# Patient Record
Sex: Male | Born: 1950 | Race: White | Hispanic: Refuse to answer | Marital: Married | Smoking: Never smoker
Health system: Southern US, Community
[De-identification: ages and names within clinical notes are randomized; demographics above are authoritative.]

## PROBLEM LIST (undated history)

## (undated) DIAGNOSIS — I10 Essential (primary) hypertension: Secondary | ICD-10-CM

## (undated) DIAGNOSIS — G629 Polyneuropathy, unspecified: Secondary | ICD-10-CM

## (undated) DIAGNOSIS — I2510 Atherosclerotic heart disease of native coronary artery without angina pectoris: Secondary | ICD-10-CM

## (undated) DIAGNOSIS — M1990 Unspecified osteoarthritis, unspecified site: Secondary | ICD-10-CM

## (undated) DIAGNOSIS — E785 Hyperlipidemia, unspecified: Secondary | ICD-10-CM

## (undated) DIAGNOSIS — E119 Type 2 diabetes mellitus without complications: Secondary | ICD-10-CM

## (undated) HISTORY — DX: Type 2 diabetes mellitus without complications: E11.9

## (undated) HISTORY — DX: Essential (primary) hypertension: I10

## (undated) HISTORY — PX: HERNIA REPAIR: SHX51

## (undated) HISTORY — DX: Hyperlipidemia, unspecified: E78.5

---
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
## 1976-11-23 HISTORY — PX: LEG SURGERY: SHX1003

## 1976-11-23 HISTORY — PX: ANKLE SURGERY: SHX546

## 2003-08-31 DIAGNOSIS — E119 Type 2 diabetes mellitus without complications: Secondary | ICD-10-CM | POA: Insufficient documentation

## 2004-03-28 DIAGNOSIS — M19079 Primary osteoarthritis, unspecified ankle and foot: Secondary | ICD-10-CM | POA: Insufficient documentation

## 2011-02-13 DIAGNOSIS — E7800 Pure hypercholesterolemia, unspecified: Secondary | ICD-10-CM | POA: Insufficient documentation

## 2014-05-24 DIAGNOSIS — Z79899 Other long term (current) drug therapy: Secondary | ICD-10-CM | POA: Insufficient documentation

## 2015-11-24 HISTORY — PX: BACK SURGERY: SHX140

## 2021-02-06 DIAGNOSIS — E113293 Type 2 diabetes mellitus with mild nonproliferative diabetic retinopathy without macular edema, bilateral: Secondary | ICD-10-CM | POA: Insufficient documentation

## 2021-09-22 DIAGNOSIS — Z87891 Personal history of nicotine dependence: Secondary | ICD-10-CM | POA: Insufficient documentation

## 2022-03-26 ENCOUNTER — Encounter: Payer: Self-pay | Admitting: Cardiovascular Disease

## 2022-03-26 ENCOUNTER — Ambulatory Visit (INDEPENDENT_AMBULATORY_CARE_PROVIDER_SITE_OTHER): Payer: Medicare Other | Admitting: Cardiovascular Disease

## 2022-03-26 DIAGNOSIS — E785 Hyperlipidemia, unspecified: Secondary | ICD-10-CM

## 2022-03-26 DIAGNOSIS — R079 Chest pain, unspecified: Secondary | ICD-10-CM | POA: Diagnosis not present

## 2022-03-26 DIAGNOSIS — R072 Precordial pain: Secondary | ICD-10-CM

## 2022-03-26 DIAGNOSIS — R0609 Other forms of dyspnea: Secondary | ICD-10-CM | POA: Diagnosis not present

## 2022-03-26 DIAGNOSIS — I10 Essential (primary) hypertension: Secondary | ICD-10-CM

## 2022-03-26 DIAGNOSIS — E119 Type 2 diabetes mellitus without complications: Secondary | ICD-10-CM

## 2022-03-26 MED ORDER — AMLODIPINE BESYLATE 5 MG PO TABS: 5.0000 mg | ORAL_TABLET | Freq: Every day | ORAL | 1 refills | Status: DC

## 2022-03-26 NOTE — Progress Notes (Signed)
? ?Cardiology Office Note   ? ?Date:  04/04/2022  ? ?ID:  Dustin Gladdenobert Macon, DOB 12-17-1950, MRN [id number] ? ?PCP:  Christena FlakeZimmer, William, MD  ?Cardiologist:  Nicki Guadalajarahomas Kenedie Dirocco, MD  ? ?Chief Complaint  ?Patient presents with  ? New Patient (Initial Visit)  ?  CAD.  ? ? ?History of Present Illness:  ?Dustin Goodwin is a 71 y.o. male who is the husband of my patient [name]  [name](Nancy) [place].  He lives in [place], [place].  He has retired and works on his farm cutting wood and mowing grass.  He has a history of hypertension as well as a history of diabetes mellitus for greater than 10 years.  Approximately 1 year ago he had experienced some chest pain associated with shortness of breath and diaphoresis.  He has been followed at [facility] in [place].  He subsequently had another episode which was relieved with nitroglycerin.  On August 27, 2021 he underwent a [place] Myoview study which raised the possibility of a small fixed defect inferiorly.  EF was 57% and there were no ECG changes.  He had an echo Doppler study on September 25, 2021 which showed an EF of 55 to 60% and he had normal wall motion.  Recently, he has experienced some recurrent episodes of chest discomfort and typically there may be 1 to 2 months without experiencing symptomatology.  His last episode occurred 1 week ago which was associated with chest pressure.  In the interim, he remains active and denies any exertional precipitation of discomfort.  Presently, he is on metoprolol succinate 50 mg daily, aspirin 81 mg milligrams, and has been on simvastatin 10 mg daily for hyperlipidemia.  He is on glimepiride, metformin and Ozempic for diabetes.  He has a history of neuropathy on gabapentin.  With his recurrent chest pains symptomatology he wished to be seen by me and presents now for initial cardiology evaluation. ? ?Past medical history is notable for hypertension, type 2 diabetes mellitus, peripheral neuropathy and hyperlipidemia. ? ?Surgical  history is notable for leg and back surgery following a severe fall where he had fallen 50 feet resulting in significant injury.  He also status post hernia surgery. ? ?Current Medications: ?Outpatient Medications Prior to Visit  ?Medication Sig Dispense Refill  ? aspirin 81 MG chewable tablet Chew 81 mg by mouth daily.    ? diclofenac (VOLTAREN) 50 MG EC tablet Take 50 mg by mouth 2 (two) times daily.    ? gabapentin (NEURONTIN) 300 MG capsule Take 300 mg by mouth 3 (three) times daily.    ? glimepiride (AMARYL) 4 MG tablet Take 1 tablet by mouth daily.    ? HYDROcodone-acetaminophen (NORCO) 7.5-325 MG tablet Take 1 tablet by mouth every 6 (six) hours as needed.    ? metFORMIN (GLUCOPHAGE-XR) 750 MG 24 hr tablet Take 1 tablet by mouth 2 (two) times daily.    ? metoprolol succinate (TOPROL-XL) 50 MG 24 hr tablet Take 50 mg by mouth daily.    ? nitroGLYCERIN (NITROSTAT) 0.4 MG SL tablet Place 0.4 mg under the tongue as directed.    ? OZEMPIC, 1 MG/DOSE, 4 MG/3ML SOPN Inject 1 mg into the skin once a week.    ? simvastatin (ZOCOR) 10 MG tablet Take 10 mg by mouth at bedtime.    ? ?No facility-administered medications prior to visit.  ?  ? ?Allergies:   Patient has no known allergies.  ? ?Social History  ? ?Socioeconomic History  ? Marital status: Married  ?  Spouse name: Not on file  ? Number of children: Not on file  ? Years of education: Not on file  ? Highest education level: Not on file  ?Occupational History  ? Not on file  ?Tobacco Use  ? Smoking status: Not on file  ? Smokeless tobacco: Not on file  ?Substance and Sexual Activity  ? Alcohol use: Not on file  ? Drug use: Not on file  ? Sexual activity: Not on file  ?Other Topics Concern  ? Not on file  ?Social History Narrative  ? Not on file  ? ?Social Determinants of Health  ? ?Financial Resource Strain: Not on file  ?Food Insecurity: Not on file  ?Transportation Needs: Not on file  ?Physical Activity: Not on file  ?Stress: Not on file  ?Social Connections:  Not on file  ?  ?Social history is notable that he was born in [place].  He has been married for 46 years.  He retired at age 79 from [employer] which [employer].  There is no tobacco alcohol history.  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 ?Family History: Both parents are deceased, mother at age 65 with diabetes father at age 75 with Parkinson's disease.  He has 2 living brothers ages 58 and 28 both with diabetes.  He has a sister age 85. ? ?ROS ?General: Negative; No fevers, chills, or night sweats;  ?HEENT: Negative; No changes in vision or hearing, sinus congestion, difficulty swallowing ?Pulmonary: Negative; No cough, wheezing, shortness of breath, hemoptysis ?Cardiovascular: See HPI ?GI: Negative; No nausea, vomiting, diarrhea, or abdominal pain ?GU: Negative; No dysuria, hematuria, or difficulty voiding ?Musculoskeletal: Remote fall leading to back and leg issues ?Hematologic/Oncology: Negative; no easy bruising, bleeding ?Endocrine: Negative; no heat/cold intolerance; no diabetes ?Neuro: Negative; no changes in balance, headaches ?Skin: Negative; No rashes or skin lesions ?Psychiatric: Negative; No behavioral problems, depression ?Sleep: Negative; No snoring, daytime sleepiness, hypersomnolence, bruxism, restless legs, hypnogognic hallucinations, no cataplexy ?Other comprehensive 14 point system review is negative. ? ? ?PHYSICAL EXAM:   ?VS:  BP 114/76 (BP Location: Left Arm, Patient Position: Sitting, Cuff Size: Normal)   Pulse 68   Ht 5\' 8"  (1.727 m)   Wt 188 lb (85.3 kg)   BMI 28.59 kg/m?    ? ?Repeat blood pressure by me was elevated at 170/80 and repeat 164/80. ? ?Wt Readings from Last 3 Encounters:  ?03/26/22 188 lb (85.3 kg)  ?  ?General: Alert, oriented, no distress.  ?Skin: normal turgor, no rashes, warm and dry ?HEENT: Normocephalic, atraumatic. Pupils equal round and reactive to light; sclera anicteric; extraocular muscles intact; ?Nose without nasal  septal hypertrophy ?Mouth/Parynx benign; Mallinpatti scale 3 ?Neck: No JVD, no carotid bruits; normal carotid upstroke ?Lungs: clear to ausculatation and percussion; no wheezing or rales ?Chest wall: without tenderness to palpitation ?Heart: PMI not displaced, RRR, s1 s2 normal, 1/6 systolic murmur, no diastolic murmur, no rubs, gallops, thrills, or heaves ?Abdomen: soft, nontender; no hepatosplenomehaly, BS+; abdominal aorta nontender and not dilated by palpation. ?Back: no CVA tenderness ?Pulses 2+ ?Musculoskeletal: full range of motion, normal strength, no joint deformities ?Extremities: no clubbing cyanosis or edema, Homan's sign negative  ?Neurologic: grossly nonfocal; Cranial nerves grossly wnl ?Psychologic: Normal mood and affect ? ? ?Studies/Labs Reviewed:  ? ?Mar 26, 2022 ECG (independently read by me):  NSR at 68, no STT changes, no ectopy, normal intervals ? ?Recent Labs: ?He brought with him recent laboratory from Dr. Mar 28, 2022 from March 20, 2022. ?Hemoglobin 14.9/hematocrit 43.4/platelets 229,000 ?Potassium 4.1, creatinine 1.32.  Glucose 155, AST 26, ALT 24. ? ?   ? View : No data to display.  ?  ?  ?  ? ? ? ?   ? View : No data to display.  ?  ?  ?  ? ? ?   ? View : No data to display.  ?  ?  ?  ? ?No results found for: MCV ?Lab Results  ?Component Value Date  ? TSH 0.648 03/27/2022  ? ?No results found for: HGBA1C ? ? ?BNP ?No results found for: BNP ? ?ProBNP ?No results found for: PROBNP ? ? ?Lipid Panel  ?   ?Component Value Date/Time  ? CHOL 90 (L) 03/27/2022 1004  ? TRIG 136 03/27/2022 1004  ? HDL 33 (L) 03/27/2022 1004  ? CHOLHDL 2.7 03/27/2022 1004  ? LDLCALC 33 03/27/2022 1004  ? LABVLDL 24 03/27/2022 1004  ? ? ? ?RADIOLOGY: ?No results found. ? ? ?Additional studies/ records that were reviewed today include:  ?I reviewed the records of Dr. Christena Flake, as well as [facility] clinic as well as his nuclear medicine stress test and 2D echo Doppler evaluations.  Laboratory was  reviewed. ? ?ASSESSMENT:   ? ?1. Chest pain of uncertain etiology   ?2. Precordial pain   ?3. Exertional dyspnea   ?4. Primary hypertension   ?5. Type 2 diabetes mellitus without complication, without long-term current use o

## 2022-03-26 NOTE — Patient Instructions (Addendum)
Medication Instructions:  ?START Amlodipine 5 mg daily  ?Take your Metoprolol 50 mg (2 hours before CT)  ? ?*If you need a refill on your cardiac medications before your next appointment, please call your pharmacy* ? ? ?Lab Work: ?LIPID, LPa, TSH- come back fasting, nothing to eat or drink.  ? ?If you have labs (blood work) drawn today and your tests are completely normal, you will receive your results only by: ?MyChart Message (if you have MyChart) OR ?A paper copy in the mail ?If you have any lab test that is abnormal or we need to change your treatment, we will call you to review the results. ? ? ?Testing/Procedures: ?Your physician has requested that you have cardiac CT. Cardiac computed tomography (CT) is a painless test that uses an x-ray machine to take clear, detailed pictures of your heart. For further information please visit [email or web address] Please follow instruction sheet as given. ? ? ?Follow-Up: ?At [facility], you and your health needs are our priority.  As part of our continuing mission to provide you with exceptional heart care, we have created designated Provider Care Teams.  These Care Teams include your primary Cardiologist (physician) and Advanced Practice Providers (APPs -  Physician Assistants and Nurse Practitioners) who all work together to provide you with the care you need, when you need it. ? ?We recommend signing up for the patient portal called "MyChart".  Sign up information is provided on this After Visit Summary.  MyChart is used to connect with patients for Virtual Visits (Telemedicine).  Patients are able to view lab/test results, encounter notes, upcoming appointments, etc.  Non-urgent messages can be sent to your provider as well.   ?To learn more about what you can do with MyChart, go to [email or web address].   ? ?Your next appointment:   ?June 5th @ 11:40  ? ?The format for your next appointment:   ?In Person ? ?Provider:   ?[name], MD  ? ? ? ?Your cardiac CT  will be scheduled at one of the below locations:  ? ?[facility] ?[address] ?[place], [place] [address] ?[phone] ? ?If scheduled at [facility], please arrive at the [facility] Entrance (Entrance C2) of [facility] 30 minutes prior to test start time. ?You can use the FREE valet parking offered at entrance C (encouraged to control the heart rate for the test)  ?Proceed to the [facility] Radiology Department (first floor) to check-in and test prep. ? ?All radiology patients and guests should use entrance C2 at [facility], accessed from [facility], even though the hospital's physical address listed is [address] ? ? ? ? ?Please follow these instructions carefully (unless otherwise directed): ? ?Hold all erectile dysfunction medications at least 3 days (72 hrs) prior to test. ? ?On the Night Before the Test: ?Be sure to Drink plenty of water. ?Do not consume any caffeinated/decaffeinated beverages or chocolate 12 hours prior to your test. ?Do not take any antihistamines 12 hours prior to your test. ? ?On the Day of the Test: ?Drink plenty of water until 1 hour prior to the test. ?Do not eat any food 4 hours prior to the test. ?You may take your regular medications prior to the test.  ?Take metoprolol (Lopressor) two hours prior to test. ?HOLD Furosemide/Hydrochlorothiazide morning of the test. ?FEMALES- please wear underwire-free bra if available, avoid dresses & tight clothing ?     ?After the Test: ?Drink plenty  of water. ?After receiving IV contrast, you may experience a mild flushed feeling. This is normal. ?On occasion, you may experience a mild rash up to 24 hours after the test. This is not dangerous. If this occurs, you can take Benadryl 25 mg and increase your fluid intake. ?If you experience trouble breathing, this can be serious. If it is severe call 911 IMMEDIATELY. If it is mild, please call our office. ?If you take any  of these medications: Glipizide/Metformin, Avandament, Glucavance, please do not take 48 hours after completing test unless otherwise instructed. ? ?We will call to schedule your test 2-4 weeks out understanding that some insurance companies will need an authorization prior to the service being performed.  ? ?For non-scheduling related questions, please contact the cardiac imaging nurse navigator should you have any questions/concerns: ?Rockwell Alexandria, Cardiac Imaging Nurse Navigator ?Larey Brick, Cardiac Imaging Nurse Navigator ?[facility] ?Direct Office Dial: [phone]  ? ?For scheduling needs, including cancellations and rescheduling, please call [place], [phone]. ? ? ? ? ? ? ? ? ?

## 2022-03-29 LAB — LIPID PANEL
Chol/HDL Ratio: 2.7 ratio (ref 0.0–5.0)
Cholesterol, Total: 90 mg/dL — ABNORMAL LOW (ref 100–199)
HDL: 33 mg/dL — ABNORMAL LOW (ref >39)
LDL Chol Calc (NIH): 33 mg/dL (ref 0–99)
Triglycerides: 136 mg/dL (ref 0–149)
VLDL Cholesterol Cal: 24 mg/dL (ref 5–40)

## 2022-03-29 LAB — LIPOPROTEIN A (LPA): Lipoprotein (a): 147.8 nmol/L — ABNORMAL HIGH (ref <75.0)

## 2022-03-29 LAB — TSH: TSH: 0.648 u[IU]/mL (ref 0.450–4.500)

## 2022-04-04 ENCOUNTER — Encounter: Payer: Self-pay | Admitting: Cardiovascular Disease

## 2022-04-10 ENCOUNTER — Telehealth (HOSPITAL_COMMUNITY): Payer: Self-pay | Admitting: *Deleted

## 2022-04-10 NOTE — Telephone Encounter (Signed)
Reaching out to patient to offer assistance regarding upcoming cardiac imaging study; pt verbalizes understanding of appt date/time, parking situation and where to check in, pre-test NPO status and verified current allergies; name and call back number provided for further questions should they arise  Larey Brick RN Navigator Cardiac Imaging [facility] [phone] office [phone] cell  Patient to take his daily medications.  He aware to arrive at 1:30pm.

## 2022-04-13 ENCOUNTER — Ambulatory Visit (HOSPITAL_COMMUNITY)
Admission: RE | Admit: 2022-04-13 | Discharge: 2022-04-13 | Disposition: A | Discharge status: Home / Self Care | Payer: Medicare Other | Source: Ambulatory Visit | Attending: Cardiovascular Disease | Admitting: Cardiovascular Disease

## 2022-04-13 ENCOUNTER — Other Ambulatory Visit: Payer: Self-pay | Admitting: Cardiovascular Disease

## 2022-04-13 DIAGNOSIS — R079 Chest pain, unspecified: Secondary | ICD-10-CM | POA: Insufficient documentation

## 2022-04-13 DIAGNOSIS — R931 Abnormal findings on diagnostic imaging of heart and coronary circulation: Secondary | ICD-10-CM | POA: Insufficient documentation

## 2022-04-13 DIAGNOSIS — R072 Precordial pain: Secondary | ICD-10-CM | POA: Insufficient documentation

## 2022-04-13 DIAGNOSIS — I2510 Atherosclerotic heart disease of native coronary artery without angina pectoris: Secondary | ICD-10-CM | POA: Diagnosis not present

## 2022-04-13 IMAGING — CT CT HEART MORP W/ CTA COR W/ SCORE W/ CA W/CM &/OR W/O CM
4 of 8 series · 8 of 20 positions shown, 9 images · IV contrast (APPLIED)
Comparison: None Available.
COMPARISON: None Available.

Addendum:
EXAM:
OVER-READ INTERPRETATION  CT CHEST

The following report is a limited chest CT over-read performed by
04/13/2022. This over-read does not include interpretation of cardiac
or coronary anatomy or pathology. The coronary calcium
score/coronary CTA interpretation by the cardiologist is attached.
CLINICAL DATA: Chest pain
Cardiac/Coronary CTA
TECHNIQUE: A non-contrast, gated CT scan was obtained with axial slices of 3 mm
through the heart for calcium scoring. Calcium scoring was performed
using the Agatston method. A 120 kV prospective, gated, contrast
cardiac scan was obtained. Gantry rotation speed was 250 msecs and
collimation was 0.6 mm. Two sublingual nitroglycerin tablets (0.8
mg) were given. The 3D data set was reconstructed in 5% intervals of
the 35-75% of the R-R cycle. Diastolic phases were analyzed on a
dedicated workstation using MPR, MIP, and VRT modes. The patient
received 95 cc of contrast.

[Series 6: ts syst sharp · axial · 0.39mm/px · z∈[-218,-185]mm · 2 of 248 slices shown]
[im 83/248  lung]
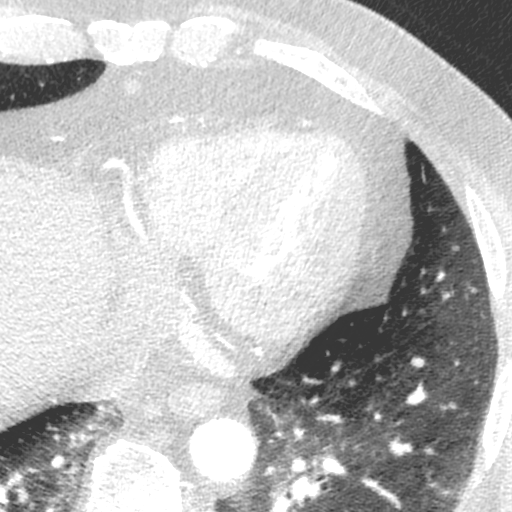
[im 165/248  lung]
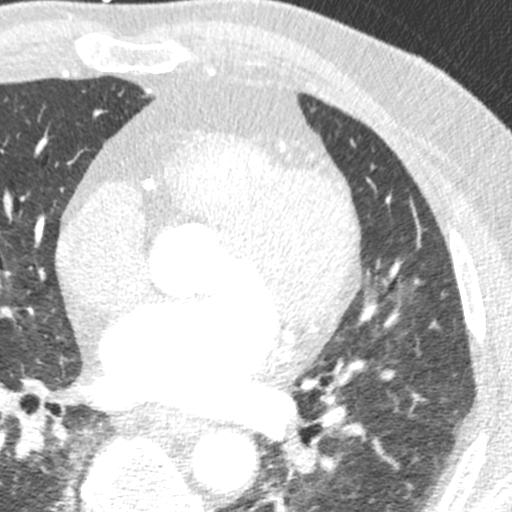

[Series 7: ts diast sharp · axial · 0.39mm/px · z∈[-218,-185]mm · 2 of 248 slices shown]
[im 83/248  lung]
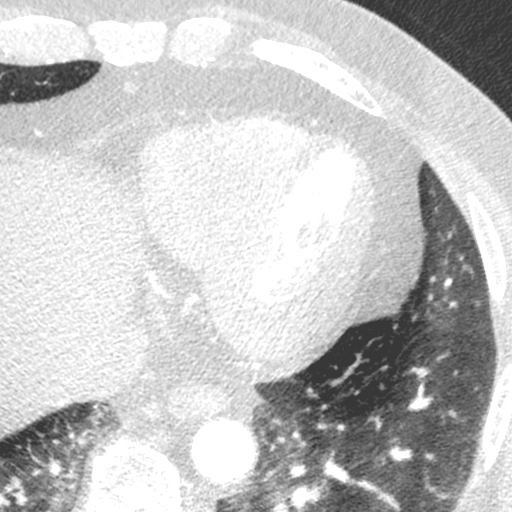
[im 165/248  lung]
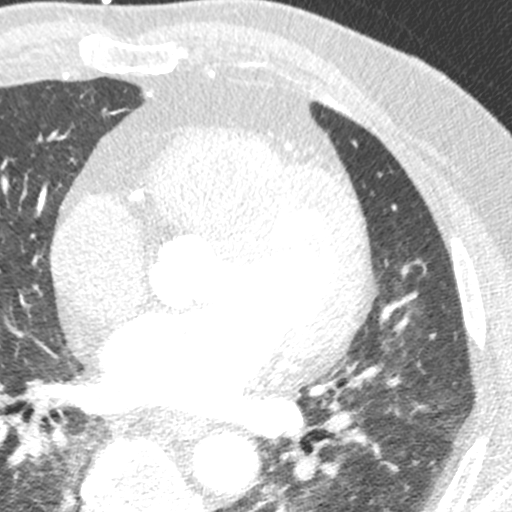

[Series 8: best syst · axial · 0.39mm/px · z∈[-218,-185]mm · 2 of 248 slices shown, 3 images]
[im 83/248  vessel]
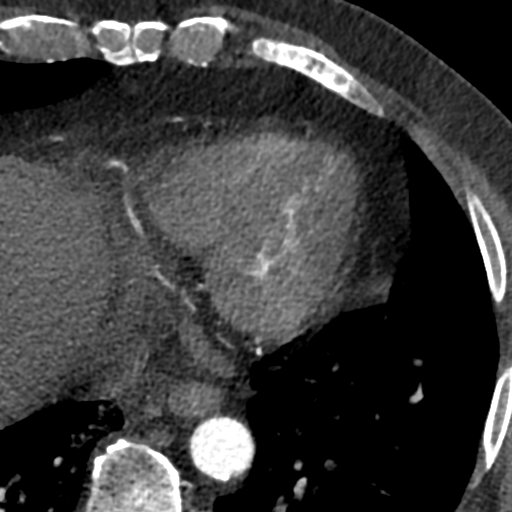
[im 83/248  lung]
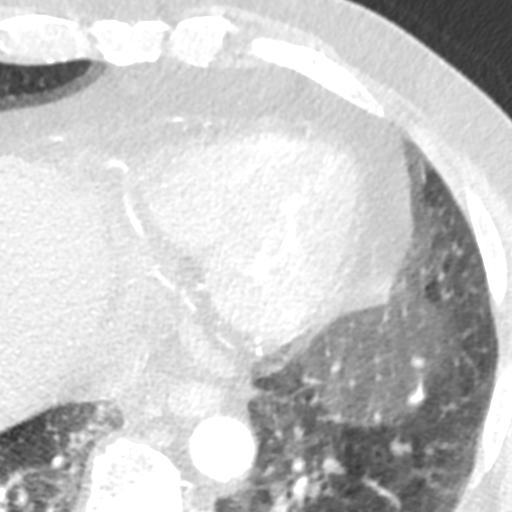
[im 165/248  vessel]
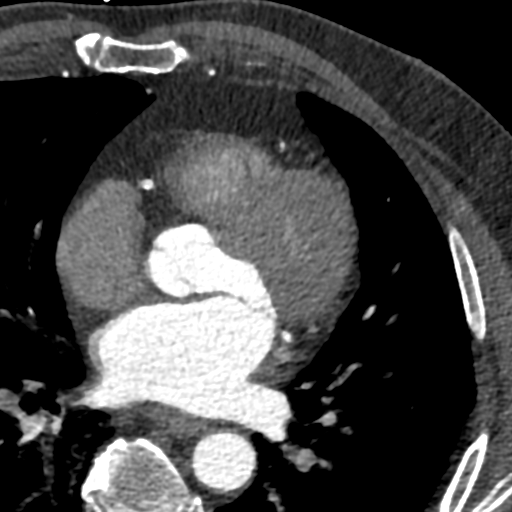

[Series 9: best diast · axial · 0.39mm/px · z∈[-218,-185]mm · 2 of 248 slices shown]
[im 83/248  vessel]
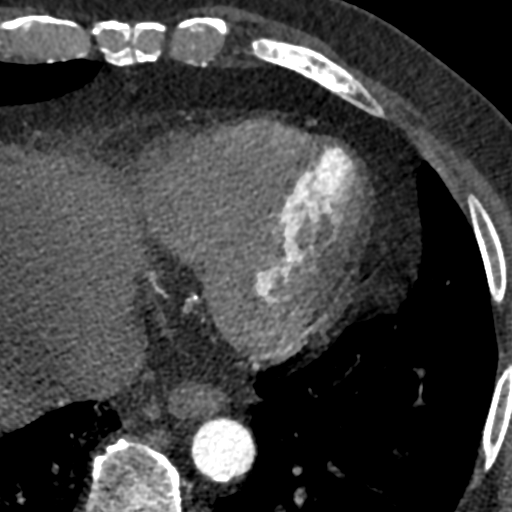
[im 165/248  vessel]
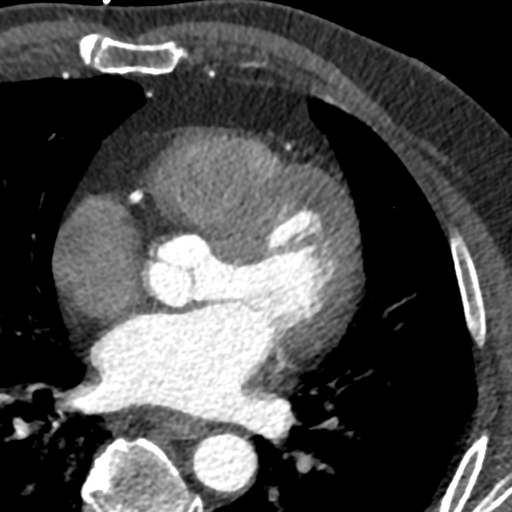

[8 of 20 positions shown; findings below may reference images not displayed]

FINDINGS: Vascular: No acute abnormality.

Mediastinum/Nodes: No mediastinal mass or adenopathy.

Lungs/Pleura: No pleural effusion. Scar versus subsegmental
atelectasis noted in the posterior right lung base, image 49/11. No
airspace consolidation. No suspicious lung nodules identified.

Upper Abdomen: No acute abnormality.

Musculoskeletal: No chest wall mass or suspicious bone lesions
identified.
IMPRESSION: 1. No significant, noncardiac supplemental findings identified.
FINDINGS: Image quality: Average.  Moderate cardiac motion artifact noted.

Noise artifact is: Limited.

Coronary Arteries:  Normal coronary origin.  Right dominance.

Left main: The left main is a large caliber vessel with a normal
take off from the left coronary cusp that bifurcates to form a left
anterior descending artery and a left circumflex artery. There is no
plaque or stenosis.

Left anterior descending artery: The proximal LAD contains a severe
calcified plaque that is circumferential (70-99%). The mid LAD is
diffusely disease with severe mixed density plaque (70-99%).
Diagonal branches are too small for analysis.

Left circumflex artery: The LCX is non-dominant. The proximal LCX
contains mild calcified plaque (25-49%). The distal LCX likely
contains severe mixed density plaque (70-99%). OM1 is too small for
analysis. OM2 contains severe mixed density plaque (70-99%). OM3 is
not evaluable due to significant artifact.

Right coronary artery: The RCA is dominant with normal take off from
the right coronary cusp. The proximal and mid segments contain mild
mixed density plaque (25-49%). The distal RCA contains mild
calcified plaque (25-49%). The PDA is not able to be evaluated due
to motion artifact.

Right Atrium: Right atrial size is within normal limits.

Right Ventricle: The right ventricular cavity is within normal
limits.

Left Atrium: Left atrial size is normal in size with no left atrial
appendage filling defect.

Left Ventricle: The ventricular cavity size is within normal limits.

Pulmonary arteries: Normal in size without proximal filling defect.

Pulmonary veins: Normal pulmonary venous drainage.

Pericardium: Normal thickness without significant effusion or
calcium present.

Cardiac valves: The aortic valve is trileaflet without significant
calcification. The mitral valve is normal without significant
calcification.

Aorta: Normal caliber without significant disease.

Extra-cardiac findings: See attached radiology report for
non-cardiac structures.
IMPRESSION: 1. Coronary calcium score of 5800. This was 88th percentile for
age-, sex, and race-matched controls.

2. Normal coronary origin with right dominance.

3. Severe stenosis in the proximal and mid LAD (70-99%).

4. Severe stenosis in the distal LCX (70-99%) and OM2.

5. Mild CAD (25-49%) in the RCA.

6. Limited evaluation of distal vessels due to poor heart rate
control/cardiac motion artifact.

RECOMMENDATIONS:
1. Severe stenosis in the LAD/LCX. CT FFR will be submitted.
Consider symptom-guided anti-ischemic pharmacotherapy as well as
risk factor modification per guideline directed care. Invasive
coronary angiography recommended with revascularization per
published guideline statements.

*** End of Addendum ***
EXAM:
OVER-READ INTERPRETATION  CT CHEST

The following report is a limited chest CT over-read performed by
04/13/2022. This over-read does not include interpretation of cardiac
or coronary anatomy or pathology. The coronary calcium
score/coronary CTA interpretation by the cardiologist is attached.
FINDINGS: Vascular: No acute abnormality.

Mediastinum/Nodes: No mediastinal mass or adenopathy.

Lungs/Pleura: No pleural effusion. Scar versus subsegmental
atelectasis noted in the posterior right lung base, image 49/11. No
airspace consolidation. No suspicious lung nodules identified.

Upper Abdomen: No acute abnormality.

Musculoskeletal: No chest wall mass or suspicious bone lesions
identified.
IMPRESSION: 1. No significant, noncardiac supplemental findings identified.

## 2022-04-13 MED ORDER — NITROGLYCERIN 0.4 MG SL SUBL
SUBLINGUAL_TABLET | SUBLINGUAL | Status: AC
  Filled 2022-04-13: qty 2

## 2022-04-13 MED ORDER — IOHEXOL 350 MG/ML SOLN
95.0000 mL | Freq: Once | INTRAVENOUS | Status: AC | PRN
  Administered 2022-04-13: 95 mL via INTRAVENOUS

## 2022-04-13 MED ORDER — METOPROLOL TARTRATE 5 MG/5ML IV SOLN
INTRAVENOUS | Status: AC
  Administered 2022-04-13: 5 mg via INTRAVENOUS
  Filled 2022-04-13: qty 10

## 2022-04-13 MED ORDER — NITROGLYCERIN 0.4 MG SL SUBL
0.8000 mg | SUBLINGUAL_TABLET | Freq: Once | SUBLINGUAL | Status: AC
  Administered 2022-04-13: 0.8 mg via SUBLINGUAL

## 2022-04-13 MED ORDER — METOPROLOL TARTRATE 5 MG/5ML IV SOLN: 5.0000 mg | INTRAVENOUS | Status: DC | PRN

## 2022-04-13 NOTE — Progress Notes (Signed)
CT FFR ordered.   Dustin Spore T. Flora Lipps, MD, [facility]  [facility]  [address], [address] [place], [place] [address] [phone]  3:59 PM

## 2022-04-14 ENCOUNTER — Ambulatory Visit (HOSPITAL_BASED_OUTPATIENT_CLINIC_OR_DEPARTMENT_OTHER)
Admission: RE | Admit: 2022-04-14 | Discharge: 2022-04-14 | Disposition: A | Discharge status: Home / Self Care | Payer: Medicare Other | Source: Ambulatory Visit | Attending: Cardiovascular Disease | Admitting: Cardiovascular Disease

## 2022-04-14 ENCOUNTER — Ambulatory Visit (HOSPITAL_COMMUNITY)
Admission: RE | Admit: 2022-04-14 | Discharge: 2022-04-14 | Disposition: A | Discharge status: Home / Self Care | Payer: Medicare Other | Source: Ambulatory Visit | Attending: Cardiovascular Disease | Admitting: Cardiovascular Disease

## 2022-04-14 DIAGNOSIS — R931 Abnormal findings on diagnostic imaging of heart and coronary circulation: Secondary | ICD-10-CM

## 2022-04-27 ENCOUNTER — Encounter: Payer: Self-pay | Admitting: Cardiovascular Disease

## 2022-04-27 ENCOUNTER — Ambulatory Visit (INDEPENDENT_AMBULATORY_CARE_PROVIDER_SITE_OTHER): Payer: Medicare Other | Admitting: Cardiovascular Disease

## 2022-04-27 VITALS — BP 120/70 | HR 59 | Ht 68.0 in | Wt 183.8 lb

## 2022-04-27 DIAGNOSIS — I209 Angina pectoris, unspecified: Secondary | ICD-10-CM | POA: Diagnosis not present

## 2022-04-27 DIAGNOSIS — R0609 Other forms of dyspnea: Secondary | ICD-10-CM | POA: Diagnosis not present

## 2022-04-27 DIAGNOSIS — R931 Abnormal findings on diagnostic imaging of heart and coronary circulation: Secondary | ICD-10-CM

## 2022-04-27 DIAGNOSIS — E785 Hyperlipidemia, unspecified: Secondary | ICD-10-CM

## 2022-04-27 DIAGNOSIS — Z01812 Encounter for preprocedural laboratory examination: Secondary | ICD-10-CM

## 2022-04-27 DIAGNOSIS — E119 Type 2 diabetes mellitus without complications: Secondary | ICD-10-CM | POA: Diagnosis not present

## 2022-04-27 DIAGNOSIS — E7841 Elevated Lipoprotein(a): Secondary | ICD-10-CM

## 2022-04-27 MED ORDER — ISOSORBIDE MONONITRATE ER 30 MG PO TB24: 30.0000 mg | ORAL_TABLET | Freq: Every day | ORAL | 3 refills | Status: DC

## 2022-04-27 NOTE — H&P (View-Only) (Signed)
Cardiology Office Note    Date:  04/27/2022   ID:  Dustin GladdenRobert Waldorf, DOB 07-06-1951, MRN [id number]  PCP:  Christena FlakeZimmer, William, MD  Cardiologist:  Nicki Guadalajarahomas Syriana Croslin, MD   1 month F/U evaluation   History of Present Illness:  Dustin Goodwin is a 71 y.o. male who is the husband of my patient Dustin AspCindy  [name](Nancy) Goodwin.  He lives in [place], [place].  He has retired and works on his farm cutting wood and mowing grass.  He has a history of hypertension as well as a history of diabetes mellitus for greater than 10 years.  Approximately 1 year ago he had experienced some chest pain associated with shortness of breath and diaphoresis.  He has been followed at [facility] in [place].  He subsequently had another episode which was relieved with nitroglycerin.  On August 27, 2021 he underwent a [place] Myoview study which raised the possibility of a small fixed defect inferiorly.  EF was 57% and there were no ECG changes.  He had an echo Doppler study on September 25, 2021 which showed an EF of 55 to 60% and he had normal wall motion.  Recently, he has experienced some recurrent episodes of chest discomfort and typically there may be 1 to 2 months without experiencing symptomatology.  His last episode occurred 1 week ago which was associated with chest pressure.  In the interim, he remains active and denies any exertional precipitation of discomfort.  Presently, he is on metoprolol succinate 50 mg daily, aspirin 81 mg milligrams, and has been on simvastatin 10 mg daily for hyperlipidemia.  He is on glimepiride, metformin and Ozempic for diabetes.  He has a history of neuropathy on gabapentin.  With his recurrent chest pains symptomatology he wished to be seen by me and I saw him for my initial evaluation on Mar 26, 2022.  At his initial evaluation, his blood pressure was elevated.  He had been on metoprolol succinate 50 mg daily and his ventricular rate was in the 60s.  I recommended follow-up laboratory  with a fasting lipid panel, TSH and LP(a).  I started him on amlodipine 5 mg both for blood pressure control and potential anti-ischemic benefit.  I recommended he undergo coronary CTA for further evaluation of his chest pain, and evaluate for coronary calcification and potential luminal stenosis.  Since I last saw him, he denies any recurrent chest pain since instituting amlodipine and his blood pressure has improved.  He underwent coronary CTA on Apr 13, 2022 which revealed an elevated calcium score at 1283, representing 88 percentile for age, sex and race matched controls.  He was felt to have severe stenosis in the proximal and mid LAD in the range of 70 to 99% and severe stenosis in the distal circumflex and a range of 70 and 99% and OM 2 vessel.  There was mild 25 to 49% stenosis in the mid RCA.  Subsequent FFR analysis was performed which is positive for hemodynamic significance in the LAD at 0.56, distal LAD less than 0.50, the distal circumflex was felt possibly to be occluded.  OM 2 vessel was 0.71.  He was notified of these results and presents now for follow-up evaluation.  Past medical history is notable for hypertension, type 2 diabetes mellitus, peripheral neuropathy and hyperlipidemia.  Surgical history is notable for leg and back surgery following a severe fall where he had fallen 50 feet resulting in significant injury.  He also status post hernia surgery.  Current Medications: Outpatient  Medications Prior to Visit  Medication Sig Dispense Refill   amLODipine (NORVASC) 5 MG tablet Take 1 tablet (5 mg total) by mouth daily. 90 tablet 1   aspirin 81 MG chewable tablet Chew 81 mg by mouth daily.     diclofenac (VOLTAREN) 50 MG EC tablet Take 50 mg by mouth 2 (two) times daily.     gabapentin (NEURONTIN) 300 MG capsule Take 300 mg by mouth 3 (three) times daily.     glimepiride (AMARYL) 4 MG tablet Take 1 tablet by mouth daily.     HYDROcodone-acetaminophen (NORCO) 7.5-325 MG tablet  Take 1 tablet by mouth every 6 (six) hours as needed.     metFORMIN (GLUCOPHAGE-XR) 750 MG 24 hr tablet Take 1 tablet by mouth 2 (two) times daily.     metoprolol succinate (TOPROL-XL) 50 MG 24 hr tablet Take 50 mg by mouth daily.     nitroGLYCERIN (NITROSTAT) 0.4 MG SL tablet Place 0.4 mg under the tongue as directed.     OZEMPIC, 1 MG/DOSE, 4 MG/3ML SOPN Inject 1 mg into the skin once a week.     simvastatin (ZOCOR) 10 MG tablet Take 10 mg by mouth at bedtime.     traMADol (ULTRAM) 50 MG tablet Take 50 mg by mouth every 6 (six) hours as needed.     No facility-administered medications prior to visit.     Allergies:   Patient has no known allergies.   Social History   Socioeconomic History   Marital status: Married    Spouse name: Not on file   Number of children: Not on file   Years of education: Not on file   Highest education level: Not on file  Occupational History   Not on file  Tobacco Use   Smoking status: Never   Smokeless tobacco: Never  Substance and Sexual Activity   Alcohol use: Not on file   Drug use: Not on file   Sexual activity: Not on file  Other Topics Concern   Not on file  Social History Narrative   Not on file   Social Determinants of Health   Financial Resource Strain: Not on file  Food Insecurity: Not on file  Transportation Needs: Not on file  Physical Activity: Not on file  Stress: Not on file  Social Connections: Not on file    Social history is notable that he was born in [place].  He has been married for 46 years.  He retired at age 52 from [employer] which [employer].  There is no tobacco alcohol history.  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  He has 2 living brothers ages 52 and 1 both with diabetes.  He has a sister age 67.  ROS General: Negative; No fevers, chills, or night  sweats;  HEENT: Negative; No changes in vision or hearing, sinus congestion, difficulty swallowing Pulmonary: Negative; No cough, wheezing, shortness of breath, hemoptysis Cardiovascular: See HPI GI: Negative; No nausea, vomiting, diarrhea, or abdominal pain GU: Negative; No dysuria, hematuria, or difficulty voiding Musculoskeletal: Remote fall leading to back and leg issues Hematologic/Oncology: Negative; no easy bruising, bleeding Endocrine: Negative; no heat/cold intolerance; no diabetes Neuro: Negative; no changes in balance, headaches Skin: Negative; No rashes or skin lesions Psychiatric: Negative; No behavioral problems, depression Sleep: Negative; No snoring, daytime sleepiness, hypersomnolence, bruxism, restless legs, hypnogognic hallucinations, no cataplexy Other  comprehensive 14 point system review is negative.   PHYSICAL EXAM:   VS:  BP 120/70 (BP Location: Left Arm)   Pulse (!) 59   Ht  (1.727 m)   Wt 183 lb 12.8 oz (83.4 kg)   SpO2 97%   BMI 27.95 kg/m     Repeat blood pressure by me was 140/70  Wt Readings from Last 3 Encounters:  04/27/22 183 lb 12.8 oz (83.4 kg)  03/26/22 188 lb (85.3 kg)   General: Alert, oriented, no distress.  Skin: normal turgor, no rashes, warm and dry HEENT: Normocephalic, atraumatic. Pupils equal round and reactive to light; sclera anicteric; extraocular muscles intact;  Nose without nasal septal hypertrophy Mouth/Parynx benign; Mallinpatti scale 3 Neck: No JVD, no carotid bruits; normal carotid upstroke Lungs: clear to ausculatation and percussion; no wheezing or rales Chest wall: without tenderness to palpitation Heart: PMI not displaced, RRR, s1 s2 normal, 1/6 systolic murmur, no diastolic murmur, no rubs, gallops, thrills, or heaves Abdomen: soft, nontender; no hepatosplenomehaly, BS+; abdominal aorta nontender and not dilated by palpation. Back: no CVA tenderness Pulses 2+ Musculoskeletal: full range of motion, normal  strength, no joint deformities Extremities: no clubbing cyanosis or edema, Homan's sign negative  Neurologic: grossly nonfocal; Cranial nerves grossly wnl Psychologic: Normal mood and affect    Studies/Labs Reviewed:   April 27, 2022 ECG (independently read by me): Sinus bradycardia at 59, no ST changes  Mar 26, 2022 ECG (independently read by me):  NSR at 68, no STT changes, no ectopy, normal intervals  Recent Labs: He brought with him recent laboratory from Dr. Christena Flake from March 20, 2022. Hemoglobin 14.9/hematocrit 43.4/platelets 229,000 Potassium 4.1, creatinine 1.32.  Glucose 155, AST 26, ALT 24.      View : No data to display.               View : No data to display.              View : No data to display.         No results found for: MCV Lab Results  Component Value Date   TSH 0.648 03/27/2022   No results found for: HGBA1C   BNP No results found for: BNP  ProBNP No results found for: PROBNP   Lipid Panel     Component Value Date/Time   CHOL 90 (L) 03/27/2022 1004   TRIG 136 03/27/2022 1004   HDL 33 (L) 03/27/2022 1004   CHOLHDL 2.7 03/27/2022 1004   LDLCALC 33 03/27/2022 1004   LABVLDL 24 03/27/2022 1004     RADIOLOGY: CT CORONARY MORPH W/CTA COR W/SCORE W/CA W/CM &/OR WO/CM  Addendum Date: 04/13/2022   ADDENDUM REPORT: 04/13/2022 15:59 CLINICAL DATA:  Chest pain EXAM: Cardiac/Coronary CTA TECHNIQUE: A non-contrast, gated CT scan was obtained with axial slices of 3 mm through the heart for calcium scoring. Calcium scoring was performed using the Agatston method. A 120 kV prospective, gated, contrast cardiac scan was obtained. Gantry rotation speed was 250 msecs and collimation was 0.6 mm. Two sublingual nitroglycerin tablets (0.8 mg) were given. The 3D data set was reconstructed in 5% intervals of the 35-75% of the R-R cycle. Diastolic phases were analyzed on a dedicated workstation using MPR, MIP, and VRT modes. The patient received  95 cc of contrast. FINDINGS: Image quality: Average.  Moderate cardiac motion artifact noted. Noise artifact is: Limited. Coronary Arteries:  Normal coronary origin.  Right dominance. Left main: The left main is a  large caliber vessel with a normal take off from the left coronary cusp that bifurcates to form a left anterior descending artery and a left circumflex artery. There is no plaque or stenosis. Left anterior descending artery: The proximal LAD contains a severe calcified plaque that is circumferential (70-99%). The mid LAD is diffusely disease with severe mixed density plaque (70-99%). Diagonal branches are too small for analysis. Left circumflex artery: The LCX is non-dominant. The proximal LCX contains mild calcified plaque (25-49%). The distal LCX likely contains severe mixed density plaque (70-99%). OM1 is too small for analysis. OM2 contains severe mixed density plaque (70-99%). OM3 is not evaluable due to significant artifact. Right coronary artery: The RCA is dominant with normal take off from the right coronary cusp. The proximal and mid segments contain mild mixed density plaque (25-49%). The distal RCA contains mild calcified plaque (25-49%). The PDA is not able to be evaluated due to motion artifact. Right Atrium: Right atrial size is within normal limits. Right Ventricle: The right ventricular cavity is within normal limits. Left Atrium: Left atrial size is normal in size with no left atrial appendage filling defect. Left Ventricle: The ventricular cavity size is within normal limits. Pulmonary arteries: Normal in size without proximal filling defect. Pulmonary veins: Normal pulmonary venous drainage. Pericardium: Normal thickness without significant effusion or calcium present. Cardiac valves: The aortic valve is trileaflet without significant calcification. The mitral valve is normal without significant calcification. Aorta: Normal caliber without significant disease. Extra-cardiac findings:  See attached radiology report for non-cardiac structures. IMPRESSION: 1. Coronary calcium score of 1283. This was 88th percentile for age-, sex, and race-matched controls. 2. Normal coronary origin with right dominance. 3. 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 4. Severe stenosis in the distal LCX (70-99%) and OM2. 5. 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 6. Limited evaluation of distal vessels due to poor heart rate control/cardiac motion artifact. RECOMMENDATIONS: 1. Severe stenosis in the LAD/LCX. CT FFR will be submitted. Consider symptom-guided anti-ischemic pharmacotherapy as well as risk factor modification per guideline directed care. Invasive coronary angiography recommended with revascularization per published guideline statements. [place] O'Neal, MD Electronically Signed   By: [place]  O'Neal M.D.   On: 04/13/2022 15:59   Result Date: 04/13/2022 EXAM: OVER-READ INTERPRETATION  CT CHEST The following report is a limited chest CT over-read performed by radiologist Dr. Taylor Stroud of [facility] on 04/13/2022. This over-read does not include interpretation of cardiac or coronary anatomy or pathology. The coronary calcium score/coronary CTA interpretation by the cardiologist is attached. COMPARISON:  None Available. FINDINGS: Vascular: No acute abnormality. Mediastinum/Nodes: No mediastinal mass or adenopathy. Lungs/Pleura: No pleural effusion. Scar versus subsegmental atelectasis noted in the posterior right lung base, image 49/11. No airspace consolidation. No suspicious lung nodules identified. Upper Abdomen: No acute abnormality. Musculoskeletal: No chest wall mass or suspicious bone lesions identified. IMPRESSION: 1. No significant, noncardiac supplemental findings identified. Electronically Signed: By: Taylor  Stroud M.D. On: 04/13/2022 15:24   CT CORONARY FRACTIONAL FLOW RESERVE DATA PREP  Result Date: 04/13/2022 EXAM: CT FFR analysis was performed on the original cardiac CTA  dataset. Diagrammatic representation of the CT FFR analysis is provided in a separate PDF document in PACS. This dictation was created using the PDF document and an interactive 3D model of the results. The 3D model is not available in the EMR/PACS. INTERPRETATION: CT FFR provides simultaneous calculation of pressure and flow across the entire coronary tree. For clinical decision making, CT FFR values   should be obtained 1-2 cm distal to the lower border of each stenosis measured. Coronary CTA-related artifacts may impair the diagnostic accuracy of the original cardiac CTA and FFR CT results. *Due to the fact that CT FFR represents a mathematically-derived analysis, it is recommended that the results be interpreted as follows: 1. CT FFR >0.80: Low likelihood of hemodynamic significance. 2. CT FFR 0.76-0.80: Borderline likelihood of hemodynamic significance. 3. CT FFR =< 0.75: High likelihood of hemodynamic significance. *Coronary CT Angiography-derived Fractional Flow Reserve Testing in Patients with Stable Coronary Artery Disease: Recommendations on Interpretation and Reporting. Radiology: Cardiothoracic Imaging. 2019;1(5):e190050 FINDINGS: 1. Left Main: 0.99; low likelihood of hemodynamic significance. 2. Proximal LAD: 0.95; low likelihood of hemodynamic significance. 3. Mid LAD: 0.56; high likelihood of hemodynamic significance. 4. Distal LAD: <0.50; high likelihood of hemodynamic significance. 5. Proximal LCX: 0.97; low likelihood of hemodynamic significance. 6. Distal LCX: Not modeled, concern for occlusion. 7. OM2: 0.71; high likelihood of hemodynamic significance. 8. RCA: 0.94; low likelihood of hemodynamic significance. IMPRESSION: 1.  CT FFR positive for mid LAD (0.56) and OM2 (0.71). 2. Distal LCX not modeled as concerning for occlusion of CT FFR analysis. 3.  Cardiac catheterization recommended. Lennie Odor, MD Electronically Signed   By: Lennie Odor M.D.   On: 04/13/2022 17:38     Additional  studies/ records that were reviewed today include:  I reviewed the records of Dr. Christena Flake, as well as [facility] as well as his nuclear medicine stress test and 2D echo Doppler evaluations.  Laboratory was reviewed.  ASSESSMENT:    1. Abnormal cardiac CT angiography   2. Angina pectoris (HCC)   3. Exertional dyspnea   4. Type 2 diabetes mellitus without complication, without long-term current use of insulin (HCC)   5. Elevated Lp(a)   6. Hyperlipidemia with target LDL less than 70   7. Pre-procedure lab exam     PLAN:  Mr. Lum Stillinger is a very pleasant 71 year old gentleman who is the wife of one of my longstanding patients.  When I saw him for my initial evaluation on on Mar 26, 2022 he admitted to at least a 10-year history of diabetes mellitus as well as hypertension.   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  He has experienced several episodes of chest discomfort associated with shortness of breath and diaphoresis.  Initially this had occurred while walking up a steep hill.  He has undergone prior evaluation with a Lexiscan Myoview study which was low risk but suggested possible small fixed defect inferiorly with EF 57%.  A  2D echo Doppler study has shown normal LV function with normal wall motion.  Only, he had begun to experience some recurrent chest pain symptomatology and had episodes which would occur at 1 to 3-month intervals concerning for angina.  He was on metoprolol succinate 50 mg and with mild blood pressure elevation and potential underlying ischemia I added amlodipine 5 mg.  He has not had any recurrent chest pain since initiation of amlodipine.  I obtained follow-up laboratory which revealed an excellent LDL cholesterol at 33 however LP(a) was significantly increased at 147.8.  Recommended that the patient initiate aspirin therapy.  He had normal TSH level.  His coronary CTA reveals an elevated calcium score at 1283  and raises concern for hemodynamic significance of LAD circumflex and OM 2 lesions.  He is here with his wife today.  I had a long discussion with him  and reviewing the CTA as well as FFR analysis.  I also reviewed data with LP(a) being significantly elevated and potential risk for vulnerable plaque.  I have recommended definitive cardiac catheterization be performed to delineate his coronary anatomy with potential need for coronary intervention.I have reviewed the risks, indications, and alternatives to cardiac catheterization, possible angioplasty, and stenting with the patient. Risks include but are not limited to bleeding, infection, vascular injury, stroke, myocardial infection, arrhythmia, kidney injury, radiation-related injury in the case of prolonged fluoroscopy use, emergency cardiac surgery, and death. The patient understands the risks of serious complication is 1-2 in 1000 with diagnostic cardiac cath and 1-2% or less with angioplasty/stenting.  With his multivessel CAD, I am electing to add low-dose isosorbide 30 mg to his medical regimen.  He understands the risk benefits of the procedure and has consented to undergo cardiac catheterization which will be tentatively scheduled for next week, May 07, 2022.  I discussed with him potential need for coronary intervention, for potential surgical revascularization and discussed with him potential need to stay overnight.  I answered all his questions.  Laboratory will be obtained prior to his procedure   Medication Adjustments/Labs and Tests Ordered: Current medicines are reviewed at length with the patient today.  Concerns regarding medicines are outlined above.  Medication changes, Labs and Tests ordered today are listed in the Patient Instructions below. Patient Instructions   [facility] [address] [place] [address] Dept: [phone] Loc:  [phone]  Mahari Fleisher  04/27/2022  You are scheduled for a Cardiac Catheterization on Wednesday, June 14 with Dr. Sherea Liptak.  1. Please arrive at the Main Entrance A at [facility]: [address] [place], [place] [address] at 6:30 AM (This time is two hours before your procedure to ensure your preparation). Free valet parking service is available.   Special note: Every effort is made to have your procedure done on time. Please understand that emergencies sometimes delay scheduled procedures.  2. Diet: Do not eat solid foods after midnight.  You may have clear liquids until 5 AM upon the day of the procedure.  3. Labs: today (BMET, CBC)  4. Medication instructions in preparation for your procedure:   Contrast Allergy: No  Hold glimepiride AM of procedure  Do not take Diabetes Med Glucophage (Metformin) on the day of the procedure and HOLD 48 HOURS AFTER THE PROCEDURE.  On the morning of your procedure, take Aspirin and any morning medicines NOT listed above.  You may use sips of water.  5. Plan to go home the same day, you will only stay overnight if medically necessary. 6. You MUST have a responsible adult to drive you home. 7. An adult MUST be with you the first 24 hours after you arrive home. 8. Bring a current list of your medications, and the last time and date medication taken. 9. Bring ID and current insurance cards. 10.Please wear clothes that are easy to get on and off and wear slip-on shoes.  Thank you for allowing us to care for you!   -- [facility]   START isosorbide (Imdur) 30 mg daily  Follow up: 4-6 weeks with Dr. Chia Rock       Signed, Elbert Spickler, MD  04/27/2022 7:04 PM    [facility] [address], [address], [place], [place]  [address] Phone: [phone]    

## 2022-04-27 NOTE — Patient Instructions (Addendum)
  [place] [address] Dept: [phone] Loc: [phone]  Dustin Goodwin  04/27/2022  You are scheduled for a Cardiac Catheterization on Wednesday, June 14 with Dr. Shelva Majestic.  1. Please arrive at the Main Entrance A at [facility]: [place], [place] [address] at 6:30 AM (This time is two hours before your procedure to ensure your preparation). Free valet parking service is available.   Special note: Every effort is made to have your procedure done on time. Please understand that emergencies sometimes delay scheduled procedures.  2. Diet: Do not eat solid foods after midnight.  You may have clear liquids until 5 AM upon the day of the procedure.  3. Labs: today (BMET, CBC)  4. Medication instructions in preparation for your procedure:   Contrast Allergy: No  Hold glimepiride AM of procedure  Do not take Diabetes Med Glucophage (Metformin) on the day of the procedure and HOLD 48 HOURS AFTER THE PROCEDURE.  On the morning of your procedure, take Aspirin and any morning medicines NOT listed above.  You may use sips of water.  5. Plan to go home the same day, you will only stay overnight if medically necessary. 6. You MUST have a responsible adult to drive you home. 7. An adult MUST be with you the first 24 hours after you arrive home. 8. Bring a current list of your medications, and the last time and date medication taken. 9. Bring ID and current insurance cards. 10.Please wear clothes that are easy to get on and off and wear slip-on shoes.  Thank you for allowing [place] to care for you!   -- [facility]   START isosorbide (Imdur) 30 mg daily  Follow up: 4-6 weeks with Dr. Claiborne Billings

## 2022-04-27 NOTE — Progress Notes (Addendum)
Cardiology Office Note    Date:  04/27/2022   ID:  Dustin Goodwin, DOB 07-06-1951, MRN [id number]  PCP:  Christena FlakeZimmer, William, MD  Cardiologist:  Nicki Guadalajarahomas Davier Tramell, MD   1 month F/U evaluation   History of Present Illness:  Dustin Goodwin is a 71 y.o. male who is the husband of my patient [name]  [name](Nancy) [place].  He lives in [place], [place].  He has retired and works on his farm cutting wood and mowing grass.  He has a history of hypertension as well as a history of diabetes mellitus for greater than 10 years.  Approximately 1 year ago he had experienced some chest pain associated with shortness of breath and diaphoresis.  He has been followed at [facility] in [place].  He subsequently had another episode which was relieved with nitroglycerin.  On August 27, 2021 he underwent a [place] Myoview study which raised the possibility of a small fixed defect inferiorly.  EF was 57% and there were no ECG changes.  He had an echo Doppler study on September 25, 2021 which showed an EF of 55 to 60% and he had normal wall motion.  Recently, he has experienced some recurrent episodes of chest discomfort and typically there may be 1 to 2 months without experiencing symptomatology.  His last episode occurred 1 week ago which was associated with chest pressure.  In the interim, he remains active and denies any exertional precipitation of discomfort.  Presently, he is on metoprolol succinate 50 mg daily, aspirin 81 mg milligrams, and has been on simvastatin 10 mg daily for hyperlipidemia.  He is on glimepiride, metformin and Ozempic for diabetes.  He has a history of neuropathy on gabapentin.  With his recurrent chest pains symptomatology he wished to be seen by me and I saw him for my initial evaluation on Mar 26, 2022.  At his initial evaluation, his blood pressure was elevated.  He had been on metoprolol succinate 50 mg daily and his ventricular rate was in the 60s.  I recommended follow-up laboratory  with a fasting lipid panel, TSH and LP(a).  I started him on amlodipine 5 mg both for blood pressure control and potential anti-ischemic benefit.  I recommended he undergo coronary CTA for further evaluation of his chest pain, and evaluate for coronary calcification and potential luminal stenosis.  Since I last saw him, he denies any recurrent chest pain since instituting amlodipine and his blood pressure has improved.  He underwent coronary CTA on Apr 13, 2022 which revealed an elevated calcium score at 1283, representing 88 percentile for age, sex and race matched controls.  He was felt to have severe stenosis in the proximal and mid LAD in the range of 70 to 99% and severe stenosis in the distal circumflex and a range of 70 and 99% and OM 2 vessel.  There was mild 25 to 49% stenosis in the mid RCA.  Subsequent FFR analysis was performed which is positive for hemodynamic significance in the LAD at 0.56, distal LAD less than 0.50, the distal circumflex was felt possibly to be occluded.  OM 2 vessel was 0.71.  He was notified of these results and presents now for follow-up evaluation.  Past medical history is notable for hypertension, type 2 diabetes mellitus, peripheral neuropathy and hyperlipidemia.  Surgical history is notable for leg and back surgery following a severe fall where he had fallen 50 feet resulting in significant injury.  He also status post hernia surgery.  Current Medications: Outpatient  Medications Prior to Visit  Medication Sig Dispense Refill   amLODipine (NORVASC) 5 MG tablet Take 1 tablet (5 mg total) by mouth daily. 90 tablet 1   aspirin 81 MG chewable tablet Chew 81 mg by mouth daily.     diclofenac (VOLTAREN) 50 MG EC tablet Take 50 mg by mouth 2 (two) times daily.     gabapentin (NEURONTIN) 300 MG capsule Take 300 mg by mouth 3 (three) times daily.     glimepiride (AMARYL) 4 MG tablet Take 1 tablet by mouth daily.     HYDROcodone-acetaminophen (NORCO) 7.5-325 MG tablet  Take 1 tablet by mouth every 6 (six) hours as needed.     metFORMIN (GLUCOPHAGE-XR) 750 MG 24 hr tablet Take 1 tablet by mouth 2 (two) times daily.     metoprolol succinate (TOPROL-XL) 50 MG 24 hr tablet Take 50 mg by mouth daily.     nitroGLYCERIN (NITROSTAT) 0.4 MG SL tablet Place 0.4 mg under the tongue as directed.     OZEMPIC, 1 MG/DOSE, 4 MG/3ML SOPN Inject 1 mg into the skin once a week.     simvastatin (ZOCOR) 10 MG tablet Take 10 mg by mouth at bedtime.     traMADol (ULTRAM) 50 MG tablet Take 50 mg by mouth every 6 (six) hours as needed.     No facility-administered medications prior to visit.     Allergies:   Patient has no known allergies.   Social History   Socioeconomic History   Marital status: Married    Spouse name: Not on file   Number of children: Not on file   Years of education: Not on file   Highest education level: Not on file  Occupational History   Not on file  Tobacco Use   Smoking status: Never   Smokeless tobacco: Never  Substance and Sexual Activity   Alcohol use: Not on file   Drug use: Not on file   Sexual activity: Not on file  Other Topics Concern   Not on file  Social History Narrative   Not on file   Social Determinants of Health   Financial Resource Strain: Not on file  Food Insecurity: Not on file  Transportation Needs: Not on file  Physical Activity: Not on file  Stress: Not on file  Social Connections: Not on file    Social history is notable that he was born in [place].  He has been married for 46 years.  He retired at age 52 from [employer] which [employer].  There is no tobacco alcohol history.  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  He has 2 living brothers ages 52 and 1 both with diabetes.  He has a sister age 67.  ROS General: Negative; No fevers, chills, or night  sweats;  HEENT: Negative; No changes in vision or hearing, sinus congestion, difficulty swallowing Pulmonary: Negative; No cough, wheezing, shortness of breath, hemoptysis Cardiovascular: See HPI GI: Negative; No nausea, vomiting, diarrhea, or abdominal pain GU: Negative; No dysuria, hematuria, or difficulty voiding Musculoskeletal: Remote fall leading to back and leg issues Hematologic/Oncology: Negative; no easy bruising, bleeding Endocrine: Negative; no heat/cold intolerance; no diabetes Neuro: Negative; no changes in balance, headaches Skin: Negative; No rashes or skin lesions Psychiatric: Negative; No behavioral problems, depression Sleep: Negative; No snoring, daytime sleepiness, hypersomnolence, bruxism, restless legs, hypnogognic hallucinations, no cataplexy Other  comprehensive 14 point system review is negative.   PHYSICAL EXAM:   VS:  BP 120/70 (BP Location: Left Arm)   Pulse (!) 59   Ht  (1.727 m)   Wt 183 lb 12.8 oz (83.4 kg)   SpO2 97%   BMI 27.95 kg/m     Repeat blood pressure by me was 140/70  Wt Readings from Last 3 Encounters:  04/27/22 183 lb 12.8 oz (83.4 kg)  03/26/22 188 lb (85.3 kg)   General: Alert, oriented, no distress.  Skin: normal turgor, no rashes, warm and dry HEENT: Normocephalic, atraumatic. Pupils equal round and reactive to light; sclera anicteric; extraocular muscles intact;  Nose without nasal septal hypertrophy Mouth/Parynx benign; Mallinpatti scale 3 Neck: No JVD, no carotid bruits; normal carotid upstroke Lungs: clear to ausculatation and percussion; no wheezing or rales Chest wall: without tenderness to palpitation Heart: PMI not displaced, RRR, s1 s2 normal, 1/6 systolic murmur, no diastolic murmur, no rubs, gallops, thrills, or heaves Abdomen: soft, nontender; no hepatosplenomehaly, BS+; abdominal aorta nontender and not dilated by palpation. Back: no CVA tenderness Pulses 2+ Musculoskeletal: full range of motion, normal  strength, no joint deformities Extremities: no clubbing cyanosis or edema, Homan's sign negative  Neurologic: grossly nonfocal; Cranial nerves grossly wnl Psychologic: Normal mood and affect    Studies/Labs Reviewed:   April 27, 2022 ECG (independently read by me): Sinus bradycardia at 59, no ST changes  Mar 26, 2022 ECG (independently read by me):  NSR at 68, no STT changes, no ectopy, normal intervals  Recent Labs: He brought with him recent laboratory from Dr. Christena Flake from March 20, 2022. Hemoglobin 14.9/hematocrit 43.4/platelets 229,000 Potassium 4.1, creatinine 1.32.  Glucose 155, AST 26, ALT 24.      View : No data to display.               View : No data to display.              View : No data to display.         No results found for: MCV Lab Results  Component Value Date   TSH 0.648 03/27/2022   No results found for: HGBA1C   BNP No results found for: BNP  ProBNP No results found for: PROBNP   Lipid Panel     Component Value Date/Time   CHOL 90 (L) 03/27/2022 1004   TRIG 136 03/27/2022 1004   HDL 33 (L) 03/27/2022 1004   CHOLHDL 2.7 03/27/2022 1004   LDLCALC 33 03/27/2022 1004   LABVLDL 24 03/27/2022 1004     RADIOLOGY: CT CORONARY MORPH W/CTA COR W/SCORE W/CA W/CM &/OR WO/CM  Addendum Date: 04/13/2022   ADDENDUM REPORT: 04/13/2022 15:59 CLINICAL DATA:  Chest pain EXAM: Cardiac/Coronary CTA TECHNIQUE: A non-contrast, gated CT scan was obtained with axial slices of 3 mm through the heart for calcium scoring. Calcium scoring was performed using the Agatston method. A 120 kV prospective, gated, contrast cardiac scan was obtained. Gantry rotation speed was 250 msecs and collimation was 0.6 mm. Two sublingual nitroglycerin tablets (0.8 mg) were given. The 3D data set was reconstructed in 5% intervals of the 35-75% of the R-R cycle. Diastolic phases were analyzed on a dedicated workstation using MPR, MIP, and VRT modes. The patient received  95 cc of contrast. FINDINGS: Image quality: Average.  Moderate cardiac motion artifact noted. Noise artifact is: Limited. Coronary Arteries:  Normal coronary origin.  Right dominance. Left main: The left main is a  large caliber vessel with a normal take off from the left coronary cusp that bifurcates to form a left anterior descending artery and a left circumflex artery. There is no plaque or stenosis. Left anterior descending artery: The proximal LAD contains a severe calcified plaque that is circumferential (70-99%). The mid LAD is diffusely disease with severe mixed density plaque (70-99%). Diagonal branches are too small for analysis. Left circumflex artery: The LCX is non-dominant. The proximal LCX contains mild calcified plaque (25-49%). The distal LCX likely contains severe mixed density plaque (70-99%). OM1 is too small for analysis. OM2 contains severe mixed density plaque (70-99%). OM3 is not evaluable due to significant artifact. Right coronary artery: The RCA is dominant with normal take off from the right coronary cusp. The proximal and mid segments contain mild mixed density plaque (25-49%). The distal RCA contains mild calcified plaque (25-49%). The PDA is not able to be evaluated due to motion artifact. Right Atrium: Right atrial size is within normal limits. Right Ventricle: The right ventricular cavity is within normal limits. Left Atrium: Left atrial size is normal in size with no left atrial appendage filling defect. Left Ventricle: The ventricular cavity size is within normal limits. Pulmonary arteries: Normal in size without proximal filling defect. Pulmonary veins: Normal pulmonary venous drainage. Pericardium: Normal thickness without significant effusion or calcium present. Cardiac valves: The aortic valve is trileaflet without significant calcification. The mitral valve is normal without significant calcification. Aorta: Normal caliber without significant disease. Extra-cardiac findings:  See attached radiology report for non-cardiac structures. IMPRESSION: 1. Coronary calcium score of 1283. This was 88th percentile for age-, sex, and race-matched controls. 2. Normal coronary origin with right dominance. 3. 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 4. Severe stenosis in the distal LCX (70-99%) and OM2. 5. 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 6. Limited evaluation of distal vessels due to poor heart rate control/cardiac motion artifact. RECOMMENDATIONS: 1. Severe stenosis in the LAD/LCX. CT FFR will be submitted. Consider symptom-guided anti-ischemic pharmacotherapy as well as risk factor modification per guideline directed care. Invasive coronary angiography recommended with revascularization per published guideline statements. Lennie Odor, MD Electronically Signed   By: Lennie Odor M.D.   On: 04/13/2022 15:59   Result Date: 04/13/2022 EXAM: OVER-READ INTERPRETATION  CT CHEST The following report is a limited chest CT over-read performed by radiologist Dr. Signa Kell of [facility] on 04/13/2022. This over-read does not include interpretation of cardiac or coronary anatomy or pathology. The coronary calcium score/coronary CTA interpretation by the cardiologist is attached. COMPARISON:  None Available. FINDINGS: Vascular: No acute abnormality. Mediastinum/Nodes: No mediastinal mass or adenopathy. Lungs/Pleura: No pleural effusion. Scar versus subsegmental atelectasis noted in the posterior right lung base, image 49/11. No airspace consolidation. No suspicious lung nodules identified. Upper Abdomen: No acute abnormality. Musculoskeletal: No chest wall mass or suspicious bone lesions identified. IMPRESSION: 1. No significant, noncardiac supplemental findings identified. Electronically Signed: By: Signa Kell M.D. On: 04/13/2022 15:24   CT CORONARY FRACTIONAL FLOW RESERVE DATA PREP  Result Date: 04/13/2022 EXAM: CT FFR analysis was performed on the original cardiac CTA  dataset. Diagrammatic representation of the CT FFR analysis is provided in a separate PDF document in PACS. This dictation was created using the PDF document and an interactive 3D model of the results. The 3D model is not available in the EMR/PACS. INTERPRETATION: CT FFR provides simultaneous calculation of pressure and flow across the entire coronary tree. For clinical decision making, CT FFR values  should be obtained 1-2 cm distal to the lower border of each stenosis measured. Coronary CTA-related artifacts may impair the diagnostic accuracy of the original cardiac CTA and FFR CT results. *Due to the fact that CT FFR represents a mathematically-derived analysis, it is recommended that the results be interpreted as follows: 1. CT FFR >0.80: Low likelihood of hemodynamic significance. 2. CT FFR 0.76-0.80: Borderline likelihood of hemodynamic significance. 3. CT FFR =< 0.75: High likelihood of hemodynamic significance. *Coronary CT Angiography-derived Fractional Flow Reserve Testing in Patients with Stable Coronary Artery Disease: Recommendations on Interpretation and Reporting. Radiology: Cardiothoracic Imaging. 2019;1(5):e190050 FINDINGS: 1. Left Main: 0.99; low likelihood of hemodynamic significance. 2. Proximal LAD: 0.95; low likelihood of hemodynamic significance. 3. Mid LAD: 0.56; high likelihood of hemodynamic significance. 4. Distal LAD: <0.50; high likelihood of hemodynamic significance. 5. Proximal LCX: 0.97; low likelihood of hemodynamic significance. 6. Distal LCX: Not modeled, concern for occlusion. 7. OM2: 0.71; high likelihood of hemodynamic significance. 8. RCA: 0.94; low likelihood of hemodynamic significance. IMPRESSION: 1.  CT FFR positive for mid LAD (0.56) and OM2 (0.71). 2. Distal LCX not modeled as concerning for occlusion of CT FFR analysis. 3.  Cardiac catheterization recommended. Lennie Odor, MD Electronically Signed   By: Lennie Odor M.D.   On: 04/13/2022 17:38     Additional  studies/ records that were reviewed today include:  I reviewed the records of Dr. Christena Flake, as well as [facility] clinic as well as his nuclear medicine stress test and 2D echo Doppler evaluations.  Laboratory was reviewed.  ASSESSMENT:    1. Abnormal cardiac CT angiography   2. Angina pectoris (HCC)   3. Exertional dyspnea   4. Type 2 diabetes mellitus without complication, without long-term current use of insulin (HCC)   5. Elevated Lp(a)   6. Hyperlipidemia with target LDL less than 70   7. Pre-procedure lab exam     PLAN:  Mr. Lum Stillinger is a very pleasant 71 year old gentleman who is the wife of one of my longstanding patients.  When I saw him for my initial evaluation on on Mar 26, 2022 he admitted to at least a 10-year history of diabetes mellitus as well as hypertension.   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  He has experienced several episodes of chest discomfort associated with shortness of breath and diaphoresis.  Initially this had occurred while walking up a steep hill.  He has undergone prior evaluation with a Lexiscan Myoview study which was low risk but suggested possible small fixed defect inferiorly with EF 57%.  A  2D echo Doppler study has shown normal LV function with normal wall motion.  Only, he had begun to experience some recurrent chest pain symptomatology and had episodes which would occur at 1 to 3-month intervals concerning for angina.  He was on metoprolol succinate 50 mg and with mild blood pressure elevation and potential underlying ischemia I added amlodipine 5 mg.  He has not had any recurrent chest pain since initiation of amlodipine.  I obtained follow-up laboratory which revealed an excellent LDL cholesterol at 33 however LP(a) was significantly increased at 147.8.  Recommended that the patient initiate aspirin therapy.  He had normal TSH level.  His coronary CTA reveals an elevated calcium score at 1283  and raises concern for hemodynamic significance of LAD circumflex and OM 2 lesions.  He is here with his wife today.  I had a long discussion with him  and reviewing the CTA as well as FFR analysis.  I also reviewed data with LP(a) being significantly elevated and potential risk for vulnerable plaque.  I have recommended definitive cardiac catheterization be performed to delineate his coronary anatomy with potential need for coronary intervention.I have reviewed the risks, indications, and alternatives to cardiac catheterization, possible angioplasty, and stenting with the patient. Risks include but are not limited to bleeding, infection, vascular injury, stroke, myocardial infection, arrhythmia, kidney injury, radiation-related injury in the case of prolonged fluoroscopy use, emergency cardiac surgery, and death. The patient understands the risks of serious complication is 1-2 in 1000 with diagnostic cardiac cath and 1-2% or less with angioplasty/stenting.  With his multivessel CAD, I am electing to add low-dose isosorbide 30 mg to his medical regimen.  He understands the risk benefits of the procedure and has consented to undergo cardiac catheterization which will be tentatively scheduled for next week, May 07, 2022.  I discussed with him potential need for coronary intervention, for potential surgical revascularization and discussed with him potential need to stay overnight.  I answered all his questions.  Laboratory will be obtained prior to his procedure   Medication Adjustments/Labs and Tests Ordered: Current medicines are reviewed at length with the patient today.  Concerns regarding medicines are outlined above.  Medication changes, Labs and Tests ordered today are listed in the Patient Instructions below. Patient Instructions    [facility] CARDIOVASCULAR DIVISION [facility] [address] [place] [address] Dept: [phone] Loc:  [phone]  Laban Orourke  04/27/2022  You are scheduled for a Cardiac Catheterization on Wednesday, June 14 with Dr. [name].  1. Please arrive at the Main Entrance A at [facility]: [address] [place], [place] [address] at 6:30 AM (This time is two hours before your procedure to ensure your preparation). Free valet parking service is available.   Special note: Every effort is made to have your procedure done on time. Please understand that emergencies sometimes delay scheduled procedures.  2. Diet: Do not eat solid foods after midnight.  You may have clear liquids until 5 AM upon the day of the procedure.  3. Labs: today (BMET, CBC)  4. Medication instructions in preparation for your procedure:   Contrast Allergy: No  Hold glimepiride AM of procedure  Do not take Diabetes Med Glucophage (Metformin) on the day of the procedure and HOLD 48 HOURS AFTER THE PROCEDURE.  On the morning of your procedure, take Aspirin and any morning medicines NOT listed above.  You may use sips of water.  5. Plan to go home the same day, you will only stay overnight if medically necessary. 6. You MUST have a responsible adult to drive you home. 7. An adult MUST be with you the first 24 hours after you arrive home. 8. Bring a current list of your medications, and the last time and date medication taken. 9. Bring ID and current insurance cards. 10.Please wear clothes that are easy to get on and off and wear slip-on shoes.  Thank you for allowing [place] to care for you!   --  Invasive Cardiovascular services   START isosorbide (Imdur) 30 mg daily  Follow up: 4-6 weeks with Dr. Tresa Endo       Signed, [name], MD  04/27/2022 7:04 PM    [facility] [address], [place], [place]  [address] Phone: [phone]

## 2022-04-28 LAB — CBC
Hematocrit: 43.5 % (ref 37.5–51.0)
Hemoglobin: 15.1 g/dL (ref 13.0–17.7)
MCH: 32.8 pg (ref 26.6–33.0)
MCHC: 34.7 g/dL (ref 31.5–35.7)
MCV: 95 fL (ref 79–97)
Platelets: 238 10*3/uL (ref 150–450)
RBC: 4.6 x10E6/uL (ref 4.14–5.80)
RDW: 13.1 % (ref 11.6–15.4)
WBC: 6.6 10*3/uL (ref 3.4–10.8)

## 2022-04-28 LAB — BASIC METABOLIC PANEL
BUN/Creatinine Ratio: 18 (ref 10–24)
BUN: 24 mg/dL (ref 8–27)
CO2: 23 mmol/L (ref 20–29)
Calcium: 9.6 mg/dL (ref 8.6–10.2)
Chloride: 102 mmol/L (ref 96–106)
Creatinine, Ser: 1.31 mg/dL — ABNORMAL HIGH (ref 0.76–1.27)
Glucose: 133 mg/dL — ABNORMAL HIGH (ref 70–99)
Potassium: 4.7 mmol/L (ref 3.5–5.2)
Sodium: 143 mmol/L (ref 134–144)
eGFR: 58 mL/min/{1.73_m2} — ABNORMAL LOW (ref >59)

## 2022-05-05 ENCOUNTER — Telehealth: Payer: Self-pay | Admitting: *Deleted

## 2022-05-05 NOTE — Telephone Encounter (Addendum)
Cardiac Catheterization scheduled at [facility] for: Wednesday May 06, 2022 8:30 AM Arrival time and place: [facility] Main Entrance A at: 6:30 AM   Nothing to eat after midnight prior to procedure, clear liquids until 5 AM day of procedure.  Medication instructions: -Hold:  Metformin-day of procedure and 48 hours post procedure  Glimepiride-AM of procedure  Voltaren-day before and day of procedure-per protocol GFR 58 -Except hold medications usual morning medications can be taken with sips of water including aspirin 81 mg.  Confirmed patient has responsible adult to drive home post procedure and be with patient first 24 hours after arriving home.  Patient reports no new symptoms concerning for COVID-19.  Reviewed procedure instructions with patient and his wife.

## 2022-05-06 ENCOUNTER — Telehealth: Payer: Self-pay | Admitting: Physician Assistant

## 2022-05-06 ENCOUNTER — Ambulatory Visit (HOSPITAL_COMMUNITY)
Admission: RE | Admit: 2022-05-06 | Discharge: 2022-05-06 | Disposition: A | Discharge status: Home / Self Care | Payer: Medicare Other | Source: Ambulatory Visit | Attending: Cardiovascular Disease | Admitting: Cardiovascular Disease

## 2022-05-06 ENCOUNTER — Encounter (HOSPITAL_COMMUNITY): Payer: Self-pay | Admitting: Cardiovascular Disease

## 2022-05-06 ENCOUNTER — Encounter (HOSPITAL_COMMUNITY)
Admission: RE | Disposition: A | Discharge status: Home / Self Care | Payer: Self-pay | Source: Ambulatory Visit | Attending: Cardiovascular Disease

## 2022-05-06 DIAGNOSIS — R0600 Dyspnea, unspecified: Secondary | ICD-10-CM | POA: Insufficient documentation

## 2022-05-06 DIAGNOSIS — I2584 Coronary atherosclerosis due to calcified coronary lesion: Secondary | ICD-10-CM | POA: Insufficient documentation

## 2022-05-06 DIAGNOSIS — I10 Essential (primary) hypertension: Secondary | ICD-10-CM | POA: Insufficient documentation

## 2022-05-06 DIAGNOSIS — Z79899 Other long term (current) drug therapy: Secondary | ICD-10-CM | POA: Insufficient documentation

## 2022-05-06 DIAGNOSIS — I25118 Atherosclerotic heart disease of native coronary artery with other forms of angina pectoris: Secondary | ICD-10-CM | POA: Diagnosis present

## 2022-05-06 DIAGNOSIS — E1140 Type 2 diabetes mellitus with diabetic neuropathy, unspecified: Secondary | ICD-10-CM | POA: Diagnosis not present

## 2022-05-06 DIAGNOSIS — Z7985 Long-term (current) use of injectable non-insulin antidiabetic drugs: Secondary | ICD-10-CM | POA: Diagnosis not present

## 2022-05-06 DIAGNOSIS — Z7984 Long term (current) use of oral hypoglycemic drugs: Secondary | ICD-10-CM | POA: Insufficient documentation

## 2022-05-06 DIAGNOSIS — E785 Hyperlipidemia, unspecified: Secondary | ICD-10-CM | POA: Diagnosis not present

## 2022-05-06 DIAGNOSIS — I2510 Atherosclerotic heart disease of native coronary artery without angina pectoris: Secondary | ICD-10-CM

## 2022-05-06 DIAGNOSIS — R931 Abnormal findings on diagnostic imaging of heart and coronary circulation: Secondary | ICD-10-CM

## 2022-05-06 HISTORY — PX: LEFT HEART CATH AND CORONARY ANGIOGRAPHY: CATH118249

## 2022-05-06 LAB — GLUCOSE, CAPILLARY: Glucose-Capillary: 150 mg/dL — ABNORMAL HIGH (ref 70–99)

## 2022-05-06 SURGERY — LEFT HEART CATH AND CORONARY ANGIOGRAPHY
Anesthesia: LOCAL

## 2022-05-06 MED ORDER — DIAZEPAM 5 MG PO TABS: 5.0000 mg | ORAL_TABLET | Freq: Four times a day (QID) | ORAL | Status: DC | PRN

## 2022-05-06 MED ORDER — HEPARIN (PORCINE) IN NACL 1000-0.9 UT/500ML-% IV SOLN
INTRAVENOUS | Status: DC | PRN
  Administered 2022-05-06 (×2): 500 mL

## 2022-05-06 MED ORDER — SODIUM CHLORIDE 0.9% FLUSH: 3.0000 mL | Freq: Two times a day (BID) | INTRAVENOUS | Status: DC

## 2022-05-06 MED ORDER — ONDANSETRON HCL 4 MG/2ML IJ SOLN: 4.0000 mg | Freq: Four times a day (QID) | INTRAMUSCULAR | Status: DC | PRN

## 2022-05-06 MED ORDER — HYDRALAZINE HCL 20 MG/ML IJ SOLN: 10.0000 mg | INTRAMUSCULAR | Status: DC | PRN

## 2022-05-06 MED ORDER — SODIUM CHLORIDE 0.9 % IV SOLN: 250.0000 mL | INTRAVENOUS | Status: DC | PRN

## 2022-05-06 MED ORDER — ACETAMINOPHEN 325 MG PO TABS: 650.0000 mg | ORAL_TABLET | ORAL | Status: DC | PRN

## 2022-05-06 MED ORDER — HEPARIN (PORCINE) IN NACL 1000-0.9 UT/500ML-% IV SOLN
INTRAVENOUS | Status: AC
  Filled 2022-05-06: qty 1000

## 2022-05-06 MED ORDER — LABETALOL HCL 5 MG/ML IV SOLN: 10.0000 mg | INTRAVENOUS | Status: DC | PRN

## 2022-05-06 MED ORDER — FENTANYL CITRATE (PF) 100 MCG/2ML IJ SOLN
INTRAMUSCULAR | Status: DC | PRN
Start: 2022-05-06 — End: 2022-05-06
  Administered 2022-05-06: 25 ug via INTRAVENOUS

## 2022-05-06 MED ORDER — HEPARIN SODIUM (PORCINE) 1000 UNIT/ML IJ SOLN
INTRAMUSCULAR | Status: AC
  Filled 2022-05-06: qty 10

## 2022-05-06 MED ORDER — FENTANYL CITRATE (PF) 100 MCG/2ML IJ SOLN
INTRAMUSCULAR | Status: AC
  Filled 2022-05-06: qty 2

## 2022-05-06 MED ORDER — SODIUM CHLORIDE 0.9 % WEIGHT BASED INFUSION: 1.0000 mL/kg/h | INTRAVENOUS | Status: DC

## 2022-05-06 MED ORDER — SODIUM CHLORIDE 0.9 % IV SOLN: INTRAVENOUS | Status: DC

## 2022-05-06 MED ORDER — LIDOCAINE HCL (PF) 1 % IJ SOLN
INTRAMUSCULAR | Status: AC
  Filled 2022-05-06: qty 30

## 2022-05-06 MED ORDER — NITROGLYCERIN 1 MG/10 ML FOR IR/CATH LAB
INTRA_ARTERIAL | Status: AC
Start: 2022-05-06 — End: ?
  Filled 2022-05-06: qty 10

## 2022-05-06 MED ORDER — VERAPAMIL HCL 2.5 MG/ML IV SOLN
INTRAVENOUS | Status: AC
  Filled 2022-05-06: qty 2

## 2022-05-06 MED ORDER — MIDAZOLAM HCL 2 MG/2ML IJ SOLN
INTRAMUSCULAR | Status: AC
  Filled 2022-05-06: qty 2

## 2022-05-06 MED ORDER — SODIUM CHLORIDE 0.9 % WEIGHT BASED INFUSION
3.0000 mL/kg/h | INTRAVENOUS | Status: AC
  Administered 2022-05-06: 3 mL/kg/h via INTRAVENOUS

## 2022-05-06 MED ORDER — IOHEXOL 350 MG/ML SOLN
INTRAVENOUS | Status: DC | PRN
  Administered 2022-05-06: 70 mL

## 2022-05-06 MED ORDER — MIDAZOLAM HCL 2 MG/2ML IJ SOLN
INTRAMUSCULAR | Status: DC | PRN
Start: 2022-05-06 — End: 2022-05-06
  Administered 2022-05-06: 2 mg via INTRAVENOUS

## 2022-05-06 MED ORDER — ASPIRIN 81 MG PO CHEW: 81.0000 mg | CHEWABLE_TABLET | ORAL | Status: DC

## 2022-05-06 MED ORDER — ASPIRIN 81 MG PO CHEW: 81.0000 mg | CHEWABLE_TABLET | Freq: Every day | ORAL | Status: DC

## 2022-05-06 MED ORDER — LIDOCAINE HCL (PF) 1 % IJ SOLN
INTRAMUSCULAR | Status: DC | PRN
  Administered 2022-05-06: 2 mL via INTRADERMAL

## 2022-05-06 MED ORDER — SODIUM CHLORIDE 0.9% FLUSH: 3.0000 mL | INTRAVENOUS | Status: DC | PRN

## 2022-05-06 MED ORDER — VERAPAMIL HCL 2.5 MG/ML IV SOLN
INTRAVENOUS | Status: DC | PRN
  Administered 2022-05-06: 10 mL via INTRA_ARTERIAL

## 2022-05-06 MED ORDER — HEPARIN SODIUM (PORCINE) 1000 UNIT/ML IJ SOLN
INTRAMUSCULAR | Status: DC | PRN
  Administered 2022-05-06: 4200 [IU] via INTRAVENOUS

## 2022-05-06 SURGICAL SUPPLY — 12 items
BAND ZEPHYR COMPRESS 30 LONG (HEMOSTASIS) ×1 IMPLANT
CATH INFINITI JR4 5F (CATHETERS) ×1 IMPLANT
CATH OPTITORQUE TIG 4.0 5F (CATHETERS) ×1 IMPLANT
ELECT DEFIB PAD ADLT CADENCE (PAD) ×1 IMPLANT
GLIDESHEATH SLEND SS 6F .021 (SHEATH) ×1 IMPLANT
GUIDEWIRE INQWIRE 1.5J.035X260 (WIRE) IMPLANT
INQWIRE 1.5J .035X260CM (WIRE) ×2
KIT HEART LEFT (KITS) ×2 IMPLANT
PACK CARDIAC CATHETERIZATION (CUSTOM PROCEDURE TRAY) ×2 IMPLANT
SHEATH PROBE COVER 6X72 (BAG) ×1 IMPLANT
TRANSDUCER W/STOPCOCK (MISCELLANEOUS) ×2 IMPLANT
TUBING CIL FLEX 10 FLL-RA (TUBING) ×2 IMPLANT

## 2022-05-06 NOTE — Progress Notes (Signed)
Patient and wife was given discharge instructions. Both verbalized understanding. 

## 2022-05-06 NOTE — Discharge Instructions (Signed)

## 2022-05-06 NOTE — Interval H&P Note (Signed)
Cath Lab Visit (complete for each Cath Lab visit)  Clinical Evaluation Leading to the Procedure:   ACS: No.  Non-ACS:    Anginal Classification: CCS II  Anti-ischemic medical therapy: Maximal Therapy (2 or more classes of medications)  Non-Invasive Test Results: Intermediate-risk stress test findings: cardiac mortality 1-3%/year  Prior CABG: No previous CABG      History and Physical Interval Note:  05/06/2022 8:34 AM  Dustin Goodwin  has presented today for surgery, with the diagnosis of abnormal ct.  The various methods of treatment have been discussed with the patient and family. After consideration of risks, benefits and other options for treatment, the patient has consented to  Procedure(s): LEFT HEART CATH AND CORONARY ANGIOGRAPHY (N/A) as a surgical intervention.  The patient's history has been reviewed, patient examined, no change in status, stable for surgery.  I have reviewed the patient's chart and labs.  Questions were answered to the patient's satisfaction.     Dustin Goodwin

## 2022-05-06 NOTE — Telephone Encounter (Signed)
Dr. Tresa Endo, the CVTS team has scheduled his appt for 05/29/22 with Dr. Cliffton Asters. You have an appt with the patient 06/10/22. Since visit is not for several weeks, would you like the patient's general cardiology moved up? If so, please reply to your nursing staff to help reschedule. Thanks.

## 2022-05-06 NOTE — Telephone Encounter (Signed)
   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   Dr. Tresa Endo did a cath on this outpatient (prompted by coronary CT) showing multivessel CAD. He feels they can go home and wants outpatient CABG eval. I have reached out to [employer] with CVTS to help coordinate this. The patient's last echo was in 09/2021 per OP notes, therefore we will update the study. This has been scheduled for tomorrow at [facility] office at 3:50pm, arrival time 3:35pm. I called short stay nurse so she could relay this to the patient and remind him of location difference from his usual office.

## 2022-05-07 ENCOUNTER — Ambulatory Visit (HOSPITAL_COMMUNITY): Payer: Medicare Other | Attending: Cardiovascular Disease

## 2022-05-07 DIAGNOSIS — I2510 Atherosclerotic heart disease of native coronary artery without angina pectoris: Secondary | ICD-10-CM | POA: Insufficient documentation

## 2022-05-07 LAB — ECHOCARDIOGRAM COMPLETE
Area-P 1/2: 3.48 cm2
S' Lateral: 2.7 cm

## 2022-05-21 ENCOUNTER — Encounter: Payer: Self-pay | Admitting: *Deleted

## 2022-05-25 NOTE — Progress Notes (Unsigned)
[address]       [place] [address]             [phone]        Finnie Fabbri [facility] Medical Record #[id number] Date of Birth: 1951/03/06  Referring: Christena Flake, MD Primary Care: Christena Flake, MD Primary Cardiologist:None  Chief Complaint:   No chief complaint on file.   History of Present Illness:     ***   Past Medical and Surgical History: Previous Chest Surgery: *** Previous Chest Radiation: *** Diabetes Mellitus: ***.  HbA1C *** Creatinine: 1.31  Past Medical History:  Diagnosis Date   Diabetes mellitus without complication (HCC)    Hyperlipidemia    Hypertension     Past Surgical History:  Procedure Laterality Date   LEFT HEART CATH AND CORONARY ANGIOGRAPHY N/A 05/06/2022   Procedure: LEFT HEART CATH AND CORONARY ANGIOGRAPHY;  Surgeon: [name], MD;  Location: [facility] INVASIVE CV LAB;  Service: Cardiovascular;  Laterality: N/A;    Social History: Support: ***  Social History   Tobacco Use  Smoking Status Never  Smokeless Tobacco Never    Social History   Substance and Sexual Activity  Alcohol Use Not on file     No Known Allergies  Medications: Asprin: *** Statin: *** Beta Blocker: *** Ace Inhibitor: *** Anti-Coagulation: ***  Current Outpatient Medications  Medication Sig Dispense Refill   amLODipine (NORVASC) 5 MG tablet Take 1 tablet (5 mg total) by mouth daily. 90 tablet 1   aspirin 81 MG chewable tablet Chew 81 mg by mouth daily.     Coenzyme Q10 200 MG capsule Take 200 mg by mouth daily.     diclofenac (VOLTAREN) 50 MG EC tablet Take 50 mg by mouth 2 (two) times daily.     gabapentin (NEURONTIN) 300 MG capsule Take 300 mg by mouth 3 (three) times daily.     glimepiride (AMARYL) 4 MG tablet Take 4 mg by mouth daily.     HYDROcodone-acetaminophen (NORCO) 7.5-325 MG tablet Take 1 tablet by mouth every 6 (six) hours as needed.     isosorbide mononitrate (IMDUR) 30 MG 24 hr tablet Take 1  tablet (30 mg total) by mouth daily. 90 tablet 3   metFORMIN (GLUCOPHAGE-XR) 750 MG 24 hr tablet Take 750 mg by mouth 2 (two) times daily.     metoprolol succinate (TOPROL-XL) 50 MG 24 hr tablet Take 50 mg by mouth daily.     nitroGLYCERIN (NITROSTAT) 0.4 MG SL tablet Place 0.4 mg under the tongue every 5 (five) minutes as needed for chest pain.     Omega-3 Fatty Acids (FISH OIL PO) Take 1,500 mg by mouth daily.     OZEMPIC, 1 MG/DOSE, 4 MG/3ML SOPN Inject 1 mg into the skin once a week. Thursday     simvastatin (ZOCOR) 10 MG tablet Take 10 mg by mouth at bedtime.     traMADol (ULTRAM) 50 MG tablet Take 50 mg by mouth 2 (two) times daily.     No current facility-administered medications for this visit.    (Not in a hospital admission)   No family history on file.   Review of Systems:   ROS    Physical Exam: There were no vitals taken for this visit. Physical Exam    Diagnostic Studies & Laboratory data:    Left Heart Catherization:  Intervention  Echo: 1. Global longitudinal strain is -24.4%. Left ventricular ejection  fraction, by estimation, is 60  to 65%. The left ventricle has normal  function. The left ventricle has no regional wall motion abnormalities.  Left ventricular diastolic parameters were  normal.   2. Right ventricular systolic function is low normal. The right  ventricular size is normal.   3. The mitral valve is normal in structure. Trivial mitral valve  regurgitation.   4. The aortic valve is normal in structure. Aortic valve regurgitation is  not visualized.  EKG: Sinus brady I have independently reviewed the above radiologic studies and discussed with the patient   Recent Lab Findings: Lab Results  Component Value Date   WBC 6.6 04/27/2022   HGB 15.1 04/27/2022   HCT 43.5 04/27/2022   PLT 238 04/27/2022   GLUCOSE 133 (H) 04/27/2022   CHOL 90 (L) 03/27/2022   TRIG 136 03/27/2022   HDL 33 (L) 03/27/2022   LDLCALC 33 03/27/2022   NA 143  04/27/2022   K 4.7 04/27/2022   CL 102 04/27/2022   CREATININE 1.31 (H) 04/27/2022   BUN 24 04/27/2022   CO2 23 04/27/2022   TSH 0.648 03/27/2022      Assessment / Plan:   *** PDA, OM2,OM4, LAD (ratty)    I  spent {CHL ONC TIME VISIT - ZOXWR:[id number]} counseling the patient face to face.   Corliss Skains 05/25/2022 12:47 PM

## 2022-05-25 NOTE — H&P (View-Only) (Signed)
[address]       [place] [address]             [phone]        Beni Turrell [facility] Medical Record #[id number] Date of Birth: 1950/12/14  Referring: [name], MD Primary Care: Christena Flake, MD Primary Cardiologist:None  Chief Complaint:    Chief Complaint  Patient presents with   Coronary Artery Disease    Surgical consult, Cardiac Cath 05/06/22/ ECHO 05/07/22/ Coronary CT 04/14/22    History of Present Illness:     71 year old male presents for surgical evaluation three-vessel coronary artery disease.  Over the last year he has had worsening exertional shortness of breath and dyspnea.  He was evaluated by cardiology and underwent a left heart cath which showed his anatomy.  He denies any palpitations, orthopnea, or lower extremity swelling.  He has had a rod placed in his right thigh along with fusion of his right ankle.   Past Medical and Surgical History: Previous Chest Surgery: No Previous Chest Radiation: No Diabetes Mellitus: Yes.  HbA1C pending Creatinine: 1.31  Past Medical History:  Diagnosis Date   Diabetes mellitus without complication (HCC)    Hyperlipidemia    Hypertension     Past Surgical History:  Procedure Laterality Date   LEFT HEART CATH AND CORONARY ANGIOGRAPHY N/A 05/06/2022   Procedure: LEFT HEART CATH AND CORONARY ANGIOGRAPHY;  Surgeon: [name], MD;  Location: [facility] INV[facility] CV LAB;  Service: Cardiovascular;  Laterality: N/A;    Social History: Support: Lives with his wife.  Social History   Tobacco Use  Smoking Status Never  Smokeless Tobacco Never    Social History   Substance and Sexual Activity  Alcohol Use Not on file     No Known Allergies   Current Outpatient Medications  Medication Sig Dispense Refill   amLODipine (NORVASC) 5 MG tablet Take 1 tablet (5 mg total) by mouth daily. 90 tablet 1   aspirin 81 MG chewable tablet Chew 81 mg by mouth daily.     Coenzyme Q10 200 MG  capsule Take 200 mg by mouth daily.     diclofenac (VOLTAREN) 50 MG EC tablet Take 50 mg by mouth 2 (two) times daily.     gabapentin (NEURONTIN) 300 MG capsule Take 300 mg by mouth 3 (three) times daily.     glimepiride (AMARYL) 4 MG tablet Take 4 mg by mouth daily.     HYDROcodone-acetaminophen (NORCO) 7.5-325 MG tablet Take 1 tablet by mouth every 6 (six) hours as needed.     isosorbide mononitrate (IMDUR) 30 MG 24 hr tablet Take 1 tablet (30 mg total) by mouth daily. 90 tablet 3   metFORMIN (GLUCOPHAGE-XR) 750 MG 24 hr tablet Take 750 mg by mouth 2 (two) times daily.     metoprolol succinate (TOPROL-XL) 50 MG 24 hr tablet Take 50 mg by mouth daily.     nitroGLYCERIN (NITROSTAT) 0.4 MG SL tablet Place 0.4 mg under the tongue every 5 (five) minutes as needed for chest pain.     Omega-3 Fatty Acids (FISH OIL PO) Take 1,500 mg by mouth daily.     OZEMPIC, 1 MG/DOSE, 4 MG/3ML SOPN Inject 1 mg into the skin once a week. Thursday     simvastatin (ZOCOR) 10 MG tablet Take 10 mg by mouth at bedtime.     traMADol (ULTRAM) 50 MG tablet Take 50 mg by mouth 2 (two) times daily.  No current facility-administered medications for this visit.    (Not in a hospital admission)   No family history on file.   Review of Systems:   Review of Systems  Constitutional: Negative.   Respiratory:  Positive for shortness of breath.   Cardiovascular:  Positive for chest pain. Negative for palpitations, orthopnea and leg swelling.  Neurological: Negative.       Physical Exam: BP 120/73   Pulse 72   Resp 20   Ht 5\' 9"  (1.753 m)   Wt 185 lb (83.9 kg)   SpO2 95% Comment: RA  BMI 27.32 kg/m  Physical Exam Constitutional:      General: He is not in acute distress.    Appearance: Normal appearance. He is normal weight.  HENT:     Head: Normocephalic and atraumatic.  Eyes:     Extraocular Movements: Extraocular movements intact.  Cardiovascular:     Rate and Rhythm: Normal rate.     Heart sounds:  No murmur heard. Pulmonary:     Effort: Pulmonary effort is normal. No respiratory distress.     Breath sounds: Normal breath sounds.  Abdominal:     General: Abdomen is flat. There is no distension.  Musculoskeletal:        General: Normal range of motion.     Cervical back: Normal range of motion.  Skin:    General: Skin is warm and dry.  Neurological:     General: No focal deficit present.     Mental Status: He is alert and oriented to person, place, and time.       Diagnostic Studies & Laboratory data:    Left Heart Catherization:  Intervention  Echo: 1. Global longitudinal strain is -24.4%. Left ventricular ejection  fraction, by estimation, is 60 to 65%. The left ventricle has normal  function. The left ventricle has no regional wall motion abnormalities.  Left ventricular diastolic parameters were  normal.   2. Right ventricular systolic function is low normal. The right  ventricular size is normal.   3. The mitral valve is normal in structure. Trivial mitral valve  regurgitation.   4. The aortic valve is normal in structure. Aortic valve regurgitation is  not visualized.  EKG: Sinus brady I have independently reviewed the above radiologic studies and discussed with the patient   Recent Lab Findings: Lab Results  Component Value Date   WBC 6.6 04/27/2022   HGB 15.1 04/27/2022   HCT 43.5 04/27/2022   PLT 238 04/27/2022   GLUCOSE 133 (H) 04/27/2022   CHOL 90 (L) 03/27/2022   TRIG 136 03/27/2022   HDL 33 (L) 03/27/2022   LDLCALC 33 03/27/2022   NA 143 04/27/2022   K 4.7 04/27/2022   CL 102 04/27/2022   CREATININE 1.31 (H) 04/27/2022   BUN 24 04/27/2022   CO2 23 04/27/2022   TSH 0.648 03/27/2022      Assessment / Plan:   71 year old male with three-vessel coronary artery disease.  His echocardiogram shows preserved biventricular function and no significant valvular disease.  On review of his left heart catheterization, he has tandem lesions in the  circumflex and required 2 grafts on that we will, his LAD is diffusely diseased, but there is a good distal target, and his PDA also appears graftable.  He has had skin grafting and rods placed in his right thigh, thus harvesting would need the left side.  The risks and benefits four-vessel CABG were discussed, and he is agreeable to proceed.  I  spent 40 minutes counseling the patient face to face.   Corliss Skains 05/28/2022 9:26 AM

## 2022-05-28 ENCOUNTER — Other Ambulatory Visit: Payer: Self-pay

## 2022-05-28 ENCOUNTER — Institutional Professional Consult (permissible substitution) (INDEPENDENT_AMBULATORY_CARE_PROVIDER_SITE_OTHER): Payer: Medicare Other | Admitting: Thoracic Surgery (Cardiothoracic Vascular Surgery)

## 2022-05-28 VITALS — BP 120/73 | HR 72 | Resp 20 | Ht 69.0 in | Wt 185.0 lb

## 2022-05-28 DIAGNOSIS — E089 Diabetes mellitus due to underlying condition without complications: Secondary | ICD-10-CM

## 2022-05-28 DIAGNOSIS — I25119 Atherosclerotic heart disease of native coronary artery with unspecified angina pectoris: Secondary | ICD-10-CM

## 2022-05-28 DIAGNOSIS — I2510 Atherosclerotic heart disease of native coronary artery without angina pectoris: Secondary | ICD-10-CM | POA: Diagnosis not present

## 2022-05-28 DIAGNOSIS — E1165 Type 2 diabetes mellitus with hyperglycemia: Secondary | ICD-10-CM

## 2022-05-28 DIAGNOSIS — I119 Hypertensive heart disease without heart failure: Secondary | ICD-10-CM

## 2022-05-28 NOTE — Pre-Procedure Instructions (Signed)
Dustin Goodwin  05/28/2022     Surgical Instructions   Your procedure is scheduled on Wed., June 03, 2022 from 8:30AM-1:38PM.  Report to [facility] Main Entrance "A" at 6:30 A.M., then check in with the Admitting office.  Call this number if you have problems the morning of surgery:  [phone]   Remember:  Do not eat or drink after midnight on July 11th    Take these medicines the morning of surgery with A SIP OF WATER: AmLODipine (NORVASC) Gabapentin (NEURONTIN)  Isosorbide mononitrate (IMDUR) Metoprolol succinate (TOPROL-XL)  HYDROcodone-acetaminophen (NORCO) nitroGLYCERIN (NITROSTAT)  As of today, STOP taking any Aspirin (unless otherwise instructed by your surgeon) Aleve, Naproxen, Ibuprofen, Motrin, Advil, Goody's, BC's, all herbal medications, fish oil, and all vitamins.  WHAT DO I DO ABOUT MY DIABETES MEDICATION?  Do not take Glimepiride (AMARYL) and MetFORMIN (GLUCOPHAGE-XR) the morning of surgery.  The day of surgery, do not take other diabetes injectable OZEMPIC.  How to Manage Your Diabetes Before and After Surgery  Why is it important to control my blood sugar before and after surgery? Improving blood sugar levels before and after surgery helps healing and can limit problems. A way of improving blood sugar control is eating a healthy diet by:  Eating less sugar and carbohydrates  Increasing activity/exercise  Talking with your doctor about reaching your blood sugar goals High blood sugars (greater than 180 mg/dL) can raise your risk of infections and slow your recovery, so you will need to focus on controlling your diabetes during the weeks before surgery. Make sure that the doctor who takes care of your diabetes knows about your planned surgery including the date and location.  How do I manage my blood sugar before surgery? Check your blood sugar at least 4 times a day, starting 2 days before surgery, to make sure that the level is not too high or  low. Check your blood sugar the morning of your surgery when you wake up and every 2 hours until you get to the Short Stay unit. If your blood sugar is less than 70 mg/dL, you will need to treat for low blood sugar: Do not take insulin. Treat a low blood sugar (less than 70 mg/dL) with  cup of clear juice (cranberry or apple), 4 glucose tablets, OR glucose gel. Recheck blood sugar in 15 minutes after treatment (to make sure it is greater than 70 mg/dL). If your blood sugar is not greater than 70 mg/dL on recheck, call [phone]  for further instructions. If your CBG is greater than 220 mg/dL, inform the staff upon arrival to Short Stay. Report your blood sugar to the short stay nurse when you get to Short Stay.  If you are admitted to the hospital after surgery: Your blood sugar will be checked by the staff and you will probably be given insulin after surgery (instead of oral diabetes medicines) to make sure you have good blood sugar levels. The goal for blood sugar control after surgery is 80-180 mg/dL.  Reviewed and Endorsed by [facility] Patient Education Committee, August 2015             Day of Surgery: Do not wear jewelry. Do not wear lotions, powders, perfumes/colognes, or deodorant. Do not shave 48 hours prior to surgery.  Men may shave face and neck. Do not bring valuables to the hospital.             [facility] is not responsible for any belongings or valuables.  Do NOT Smoke (Tobacco/Vaping)  24 hours prior to your procedure  If you use a CPAP at night, you may bring your mask and machine for your overnight stay.   Contacts, glasses, hearing aids, dentures or partials may not be worn into surgery, please bring cases for these belongings   For patients admitted to the hospital, discharge time will be determined by your treatment team.   Patients discharged the day of surgery will not be allowed to drive home, and someone needs to stay with them for 24 hours.  Special  instructions:    Oral Hygiene is also important to reduce your risk of infection.  Remember - BRUSH YOUR TEETH THE MORNING OF SURGERY WITH YOUR REGULAR TOOTHPASTE   [facility]- Preparing For Surgery  Before surgery, you can play an important role. Because skin is not sterile, your skin needs to be as free of germs as possible. You can reduce the number of germs on your skin by washing with CHG (chlorahexidine gluconate) Soap before surgery.  CHG is an antiseptic cleaner which kills germs and bonds with the skin to continue killing germs even after washing.    Please do not use if you have an allergy to CHG or antibacterial soaps. If your skin becomes reddened/irritated stop using the CHG.  Do not shave (including legs and underarms) for at least 48 hours prior to first CHG shower. It is OK to shave your face.  Please follow these instructions carefully.    Shower the NIGHT BEFORE SURGERY and the MORNING OF SURGERY with CHG Soap.   If you chose to wash your hair, wash your hair first as usual with your normal shampoo. After you shampoo, rinse your hair and body thoroughly to remove the shampoo.  Then [employer] and genitals (private parts) with your normal soap and rinse thoroughly to remove soap.  After that Use CHG Soap as you would any other liquid soap. You can apply CHG directly to the skin and wash gently with a scrungie or a clean washcloth.   Apply the CHG Soap to your body ONLY FROM THE NECK DOWN.  Do not use on open wounds or open sores. Avoid contact with your eyes, ears, mouth and genitals (private parts). Wash Face and genitals (private parts)  with your normal soap.   Wash thoroughly, paying special attention to the area where your surgery will be performed.  Thoroughly rinse your body with warm water from the neck down.  DO NOT shower/wash with your normal soap after using and rinsing off the CHG Soap.  Pat yourself dry with a CLEAN TOWEL.  Wear CLEAN PAJAMAS to bed the  night before surgery  Place CLEAN SHEETS on your bed the night before your surgery  DO NOT SLEEP WITH PETS.  Reminder: Take a shower with CHG soap. Wear Clean/Comfortable clothing the morning of surgery Do not apply any deodorants/lotions.   Remember to brush your teeth WITH YOUR REGULAR TOOTHPASTE.  If you received a COVID test during your pre-op visit  it is requested that you wear a mask when out in public, stay away from anyone that may not be feeling well and notify your surgeon if you develop symptoms. If you have been in contact with anyone that has tested positive in the last 10 days please notify you surgeon.  Notify your provider:  if you develop a fever of 100.4 or greater, sneezing, cough, sore throat, shortness of breath or body aches.  NO VISITORS WILL  BE ALLOWED IN PRE-OP WHERE PATIENTS ARE PREPPED FOR SURGERY.    SURGICAL WAITING ROOM VISITATION Patients having surgery or a procedure in a hospital may have two support people. Children under the age of 42 must have an adult with them who is not the patient. They may stay in the waiting area during the procedure and may switch out with other visitors. If the patient needs to stay at the hospital during part of their recovery, the visitor guidelines for inpatient rooms apply.  Please refer to the [facility] website for the visitor guidelines for Inpatients (after your surgery is over and you are in a regular room).   Please read over the following fact sheets that you were given.

## 2022-05-29 ENCOUNTER — Other Ambulatory Visit: Payer: Self-pay

## 2022-05-29 ENCOUNTER — Encounter (HOSPITAL_COMMUNITY)
Admission: RE | Admit: 2022-05-29 | Discharge: 2022-05-29 | Disposition: A | Discharge status: Home / Self Care | Payer: Medicare Other | Source: Ambulatory Visit | Attending: Thoracic Surgery (Cardiothoracic Vascular Surgery) | Admitting: Thoracic Surgery (Cardiothoracic Vascular Surgery)

## 2022-05-29 ENCOUNTER — Ambulatory Visit (HOSPITAL_COMMUNITY)
Admission: RE | Admit: 2022-05-29 | Discharge: 2022-05-29 | Disposition: A | Discharge status: Home / Self Care | Payer: Medicare Other | Source: Ambulatory Visit | Attending: Thoracic Surgery (Cardiothoracic Vascular Surgery) | Admitting: Thoracic Surgery (Cardiothoracic Vascular Surgery)

## 2022-05-29 ENCOUNTER — Encounter (HOSPITAL_COMMUNITY): Payer: Self-pay

## 2022-05-29 ENCOUNTER — Encounter: Payer: Medicare Other | Admitting: Thoracic Surgery (Cardiothoracic Vascular Surgery)

## 2022-05-29 ENCOUNTER — Other Ambulatory Visit (HOSPITAL_COMMUNITY): Payer: Medicare Other

## 2022-05-29 VITALS — BP 125/73 | HR 66 | Temp 97.7°F | Resp 17 | Ht 69.0 in | Wt 184.2 lb

## 2022-05-29 DIAGNOSIS — E1165 Type 2 diabetes mellitus with hyperglycemia: Secondary | ICD-10-CM

## 2022-05-29 DIAGNOSIS — I25119 Atherosclerotic heart disease of native coronary artery with unspecified angina pectoris: Secondary | ICD-10-CM | POA: Diagnosis not present

## 2022-05-29 DIAGNOSIS — Z01818 Encounter for other preprocedural examination: Secondary | ICD-10-CM | POA: Insufficient documentation

## 2022-05-29 DIAGNOSIS — E089 Diabetes mellitus due to underlying condition without complications: Secondary | ICD-10-CM | POA: Diagnosis not present

## 2022-05-29 DIAGNOSIS — Z20822 Contact with and (suspected) exposure to covid-19: Secondary | ICD-10-CM | POA: Diagnosis not present

## 2022-05-29 HISTORY — DX: Polyneuropathy, unspecified: G62.9

## 2022-05-29 HISTORY — DX: Atherosclerotic heart disease of native coronary artery without angina pectoris: I25.10

## 2022-05-29 HISTORY — DX: Unspecified osteoarthritis, unspecified site: M19.90

## 2022-05-29 LAB — COMPREHENSIVE METABOLIC PANEL
ALT: 18 U/L (ref 0–44)
AST: 19 U/L (ref 15–41)
Albumin: 4 g/dL (ref 3.5–5.0)
Alkaline Phosphatase: 60 U/L (ref 38–126)
Anion gap: 11 (ref 5–15)
BUN: 30 mg/dL — ABNORMAL HIGH (ref 8–23)
CO2: 23 mmol/L (ref 22–32)
Calcium: 9.2 mg/dL (ref 8.9–10.3)
Chloride: 104 mmol/L (ref 98–111)
Creatinine, Ser: 1.74 mg/dL — ABNORMAL HIGH (ref 0.61–1.24)
GFR, Estimated: 41 mL/min — ABNORMAL LOW (ref >60)
Glucose, Bld: 228 mg/dL — ABNORMAL HIGH (ref 70–99)
Potassium: 4.2 mmol/L (ref 3.5–5.1)
Sodium: 138 mmol/L (ref 135–145)
Total Bilirubin: 1 mg/dL (ref 0.3–1.2)
Total Protein: 6.8 g/dL (ref 6.5–8.1)

## 2022-05-29 LAB — URINALYSIS, ROUTINE W REFLEX MICROSCOPIC
Bilirubin Urine: NEGATIVE
Glucose, UA: NEGATIVE mg/dL
Hgb urine dipstick: NEGATIVE
Ketones, ur: NEGATIVE mg/dL
Leukocytes,Ua: NEGATIVE
Nitrite: NEGATIVE
Protein, ur: NEGATIVE mg/dL
Specific Gravity, Urine: 1.017 (ref 1.005–1.030)
pH: 5 (ref 5.0–8.0)

## 2022-05-29 LAB — TYPE AND SCREEN
ABO/RH(D): O POS
Antibody Screen: NEGATIVE

## 2022-05-29 LAB — PROTIME-INR
INR: 1.1 (ref 0.8–1.2)
Prothrombin Time: 13.9 seconds (ref 11.4–15.2)

## 2022-05-29 LAB — CBC
HCT: 41.8 % (ref 39.0–52.0)
Hemoglobin: 14 g/dL (ref 13.0–17.0)
MCH: 32.6 pg (ref 26.0–34.0)
MCHC: 33.5 g/dL (ref 30.0–36.0)
MCV: 97.2 fL (ref 80.0–100.0)
Platelets: 218 10*3/uL (ref 150–400)
RBC: 4.3 MIL/uL (ref 4.22–5.81)
RDW: 12.8 % (ref 11.5–15.5)
WBC: 5.7 10*3/uL (ref 4.0–10.5)
nRBC: 0 % (ref 0.0–0.2)

## 2022-05-29 LAB — HEMOGLOBIN A1C
Hgb A1c MFr Bld: 6.6 % — ABNORMAL HIGH (ref 4.8–5.6)
Mean Plasma Glucose: 142.72 mg/dL

## 2022-05-29 LAB — SARS CORONAVIRUS 2 (TAT 6-24 HRS): SARS Coronavirus 2: NEGATIVE

## 2022-05-29 LAB — SURGICAL PCR SCREEN
MRSA, PCR: NEGATIVE
Staphylococcus aureus: NEGATIVE

## 2022-05-29 LAB — GLUCOSE, CAPILLARY: Glucose-Capillary: 239 mg/dL — ABNORMAL HIGH (ref 70–99)

## 2022-05-29 LAB — APTT: aPTT: 32 seconds (ref 24–36)

## 2022-05-29 NOTE — Progress Notes (Signed)
PCP - Dr. Christena Flake Cardiologist - Dr. [name]  PPM/ICD - n/a  Chest x-ray - 05/29/22 EKG - 05/29/22 Stress Test - 08/27/21 ECHO - 05/07/22 Cardiac Cath - 05/06/22  Sleep Study - denies CPAP - denies  Fasting Blood Sugar - 95-120 Checks Blood Sugar 1 time a week CBG at PAT 239-pt ate a honey bun and regular soda prior to arrival to appt. Will collect A1C  Blood Thinner Instructions: n/a Aspirin Instructions: n/a  NPo at MD  COVID TEST- 05/29/22, done in PAT  Anesthesia review: Yes, cardiac hx.  Patient denies shortness of breath, fever, cough and chest pain at PAT appointment   All instructions explained to the patient, with a verbal understanding of the material. Patient agrees to go over the instructions while at home for a better understanding. Patient also instructed to self quarantine after being tested for COVID-19. The opportunity to ask questions was provided.

## 2022-05-29 NOTE — Progress Notes (Signed)
Pre-CABG study completed. Please see CV Proc for preliminary results.  Glenard Keesling BS, RVT 05/29/2022 9:05 AM

## 2022-06-01 ENCOUNTER — Telehealth: Payer: Self-pay | Admitting: Cardiovascular Disease

## 2022-06-01 NOTE — Telephone Encounter (Signed)
Spouse asked if patient should keep 7/19 appointment because patient is having CABG on 7/12. Recommended she wait until 7/17 to cancel and reschedule or keep appointment.

## 2022-06-01 NOTE — Telephone Encounter (Signed)
Pt's wife is requesting a call back to get appointment carnification due to having a bypass surgery coming up. She states they do not believe he will be out of hospital in time for appt on 07/19, but would like to know if he can get something else for right after his surgery with Dr. Tresa Endo. Requesting a call back.

## 2022-06-02 ENCOUNTER — Encounter (HOSPITAL_COMMUNITY): Payer: Self-pay | Admitting: Thoracic Surgery (Cardiothoracic Vascular Surgery)

## 2022-06-02 MED ORDER — CEFAZOLIN SODIUM-DEXTROSE 2-4 GM/100ML-% IV SOLN
2.0000 g | INTRAVENOUS | Status: AC
  Administered 2022-06-03: 2 g via INTRAVENOUS
  Filled 2022-06-02: qty 100

## 2022-06-02 MED ORDER — TRANEXAMIC ACID (OHS) PUMP PRIME SOLUTION
2.0000 mg/kg | INTRAVENOUS | Status: DC
  Filled 2022-06-02: qty 1.67

## 2022-06-02 MED ORDER — NOREPINEPHRINE 4 MG/250ML-% IV SOLN
0.0000 ug/min | INTRAVENOUS | Status: DC
  Filled 2022-06-02: qty 250

## 2022-06-02 MED ORDER — TRANEXAMIC ACID (OHS) BOLUS VIA INFUSION
15.0000 mg/kg | INTRAVENOUS | Status: AC
  Administered 2022-06-03: 1138.5 mg via INTRAVENOUS
  Filled 2022-06-02: qty 1139

## 2022-06-02 MED ORDER — MILRINONE LACTATE IN DEXTROSE 20-5 MG/100ML-% IV SOLN
0.3000 ug/kg/min | INTRAVENOUS | Status: DC
  Filled 2022-06-02: qty 100

## 2022-06-02 MED ORDER — DEXMEDETOMIDINE HCL IN NACL 400 MCG/100ML IV SOLN
0.1000 ug/kg/h | INTRAVENOUS | Status: AC
  Administered 2022-06-03: .2 ug/kg/h via INTRAVENOUS
  Filled 2022-06-02: qty 100

## 2022-06-02 MED ORDER — NITROGLYCERIN IN D5W 200-5 MCG/ML-% IV SOLN
2.0000 ug/min | INTRAVENOUS | Status: AC
  Administered 2022-06-03: 5 ug/min via INTRAVENOUS
  Filled 2022-06-02: qty 250

## 2022-06-02 MED ORDER — MANNITOL 20 % IV SOLN
INTRAVENOUS | Status: DC
  Filled 2022-06-02: qty 13

## 2022-06-02 MED ORDER — VANCOMYCIN HCL 1250 MG/250ML IV SOLN
1250.0000 mg | INTRAVENOUS | Status: AC
  Administered 2022-06-03: 1250 mg via INTRAVENOUS
  Filled 2022-06-02: qty 250

## 2022-06-02 MED ORDER — TRANEXAMIC ACID 1000 MG/10ML IV SOLN
1.5000 mg/kg/h | INTRAVENOUS | Status: AC
  Administered 2022-06-03: 1.5 mg/kg/h via INTRAVENOUS
  Filled 2022-06-02: qty 25

## 2022-06-02 MED ORDER — EPINEPHRINE HCL 5 MG/250ML IV SOLN IN NS
0.0000 ug/min | INTRAVENOUS | Status: DC
  Filled 2022-06-02: qty 250

## 2022-06-02 MED ORDER — POTASSIUM CHLORIDE 2 MEQ/ML IV SOLN
80.0000 meq | INTRAVENOUS | Status: DC
  Filled 2022-06-02: qty 40

## 2022-06-02 MED ORDER — PHENYLEPHRINE HCL-NACL 20-0.9 MG/250ML-% IV SOLN
30.0000 ug/min | INTRAVENOUS | Status: AC
  Administered 2022-06-03: 25 ug/min via INTRAVENOUS
  Filled 2022-06-02: qty 250

## 2022-06-02 MED ORDER — PLASMA-LYTE A IV SOLN
INTRAVENOUS | Status: DC
  Filled 2022-06-02: qty 2.5

## 2022-06-02 MED ORDER — HEPARIN 30,000 UNITS/1000 ML (OHS) CELLSAVER SOLUTION
Status: DC
  Filled 2022-06-02: qty 1000

## 2022-06-02 MED ORDER — INSULIN REGULAR(HUMAN) IN NACL 100-0.9 UT/100ML-% IV SOLN
INTRAVENOUS | Status: AC
  Administered 2022-06-03: 1 [IU]/h via INTRAVENOUS
  Filled 2022-06-02: qty 100

## 2022-06-02 NOTE — Anesthesia Preprocedure Evaluation (Signed)
Anesthesia Evaluation  Patient identified by MRN, date of birth, ID band Patient awake    Reviewed: Allergy & Precautions, H&P , NPO status , Patient's Chart, lab work & pertinent test results, reviewed documented beta blocker date and time   Airway Mallampati: II  TM Distance: >3 FB Neck ROM: Full    Dental no notable dental hx. (+) Teeth Intact, Dental Advisory Given   Pulmonary neg pulmonary ROS,    Pulmonary exam normal breath sounds clear to auscultation       Cardiovascular Exercise Tolerance: Good hypertension, Pt. on medications and Pt. on home beta blockers + CAD   Rhythm:Regular Rate:Normal     Neuro/Psych negative neurological ROS  negative psych ROS   GI/Hepatic negative GI ROS, Neg liver ROS,   Endo/Other  diabetes, Type 2, Oral Hypoglycemic Agents  Renal/GU negative Renal ROS  negative genitourinary   Musculoskeletal  (+) Arthritis , Osteoarthritis,    Abdominal   Peds  Hematology negative hematology ROS (+)   Anesthesia Other Findings   Reproductive/Obstetrics negative OB ROS                            Anesthesia Physical Anesthesia Plan  ASA: 4  Anesthesia Plan: General   Post-op Pain Management: Tylenol PO (pre-op)*   Induction: Intravenous  PONV Risk Score and Plan: 2 and Midazolam, Ondansetron and Treatment may vary due to age or medical condition  Airway Management Planned: Oral ETT  Additional Equipment: Arterial line, CVP, TEE and Ultrasound Guidance Line Placement  Intra-op Plan:   Post-operative Plan: Post-operative intubation/ventilation  Informed Consent: I have reviewed the patients History and Physical, chart, labs and discussed the procedure including the risks, benefits and alternatives for the proposed anesthesia with the patient or authorized representative who has indicated his/her understanding and acceptance.     Dental advisory  given  Plan Discussed with: CRNA  Anesthesia Plan Comments:        Anesthesia Quick Evaluation

## 2022-06-03 ENCOUNTER — Inpatient Hospital Stay (HOSPITAL_COMMUNITY)
Admission: RE | Admit: 2022-06-03 | Discharge: 2022-06-08 | DRG: 236 | Disposition: A | Discharge status: Home / Self Care | Payer: Medicare Other | Source: Ambulatory Visit | Attending: Thoracic Surgery (Cardiothoracic Vascular Surgery) | Admitting: Thoracic Surgery (Cardiothoracic Vascular Surgery)

## 2022-06-03 ENCOUNTER — Inpatient Hospital Stay (HOSPITAL_COMMUNITY): Payer: Medicare Other

## 2022-06-03 ENCOUNTER — Inpatient Hospital Stay (HOSPITAL_COMMUNITY): Payer: Medicare Other | Admitting: Certified Registered"

## 2022-06-03 ENCOUNTER — Other Ambulatory Visit: Payer: Self-pay

## 2022-06-03 ENCOUNTER — Other Ambulatory Visit (HOSPITAL_COMMUNITY): Payer: Medicare Other

## 2022-06-03 ENCOUNTER — Inpatient Hospital Stay (HOSPITAL_COMMUNITY)
Admission: RE | Disposition: A | Discharge status: Home / Self Care | Payer: Self-pay | Source: Ambulatory Visit | Attending: Thoracic Surgery (Cardiothoracic Vascular Surgery)

## 2022-06-03 DIAGNOSIS — Z951 Presence of aortocoronary bypass graft: Principal | ICD-10-CM

## 2022-06-03 DIAGNOSIS — I129 Hypertensive chronic kidney disease with stage 1 through stage 4 chronic kidney disease, or unspecified chronic kidney disease: Secondary | ICD-10-CM | POA: Diagnosis present

## 2022-06-03 DIAGNOSIS — I4891 Unspecified atrial fibrillation: Secondary | ICD-10-CM | POA: Diagnosis not present

## 2022-06-03 DIAGNOSIS — Z20822 Contact with and (suspected) exposure to covid-19: Secondary | ICD-10-CM | POA: Diagnosis present

## 2022-06-03 DIAGNOSIS — N189 Chronic kidney disease, unspecified: Secondary | ICD-10-CM | POA: Diagnosis present

## 2022-06-03 DIAGNOSIS — D62 Acute posthemorrhagic anemia: Secondary | ICD-10-CM | POA: Diagnosis not present

## 2022-06-03 DIAGNOSIS — Z79899 Other long term (current) drug therapy: Secondary | ICD-10-CM

## 2022-06-03 DIAGNOSIS — D72829 Elevated white blood cell count, unspecified: Secondary | ICD-10-CM | POA: Diagnosis not present

## 2022-06-03 DIAGNOSIS — M1990 Unspecified osteoarthritis, unspecified site: Secondary | ICD-10-CM | POA: Diagnosis present

## 2022-06-03 DIAGNOSIS — Z7985 Long-term (current) use of injectable non-insulin antidiabetic drugs: Secondary | ICD-10-CM

## 2022-06-03 DIAGNOSIS — I119 Hypertensive heart disease without heart failure: Secondary | ICD-10-CM

## 2022-06-03 DIAGNOSIS — Z7982 Long term (current) use of aspirin: Secondary | ICD-10-CM

## 2022-06-03 DIAGNOSIS — I10 Essential (primary) hypertension: Secondary | ICD-10-CM | POA: Diagnosis not present

## 2022-06-03 DIAGNOSIS — I25119 Atherosclerotic heart disease of native coronary artery with unspecified angina pectoris: Secondary | ICD-10-CM | POA: Diagnosis present

## 2022-06-03 DIAGNOSIS — I471 Supraventricular tachycardia: Secondary | ICD-10-CM | POA: Diagnosis not present

## 2022-06-03 DIAGNOSIS — E785 Hyperlipidemia, unspecified: Secondary | ICD-10-CM | POA: Diagnosis present

## 2022-06-03 DIAGNOSIS — I2510 Atherosclerotic heart disease of native coronary artery without angina pectoris: Secondary | ICD-10-CM

## 2022-06-03 DIAGNOSIS — E1122 Type 2 diabetes mellitus with diabetic chronic kidney disease: Secondary | ICD-10-CM | POA: Diagnosis present

## 2022-06-03 DIAGNOSIS — E119 Type 2 diabetes mellitus without complications: Secondary | ICD-10-CM | POA: Diagnosis not present

## 2022-06-03 DIAGNOSIS — E1140 Type 2 diabetes mellitus with diabetic neuropathy, unspecified: Secondary | ICD-10-CM | POA: Diagnosis present

## 2022-06-03 DIAGNOSIS — N179 Acute kidney failure, unspecified: Secondary | ICD-10-CM | POA: Diagnosis not present

## 2022-06-03 DIAGNOSIS — Z7984 Long term (current) use of oral hypoglycemic drugs: Secondary | ICD-10-CM

## 2022-06-03 HISTORY — PX: CORONARY ARTERY BYPASS GRAFT: SHX141

## 2022-06-03 HISTORY — PX: TEE WITHOUT CARDIOVERSION: SHX5443

## 2022-06-03 LAB — BASIC METABOLIC PANEL
Anion gap: 9 (ref 5–15)
BUN: 24 mg/dL — ABNORMAL HIGH (ref 8–23)
CO2: 21 mmol/L — ABNORMAL LOW (ref 22–32)
Calcium: 8.5 mg/dL — ABNORMAL LOW (ref 8.9–10.3)
Chloride: 108 mmol/L (ref 98–111)
Creatinine, Ser: 1.58 mg/dL — ABNORMAL HIGH (ref 0.61–1.24)
GFR, Estimated: 46 mL/min — ABNORMAL LOW (ref >60)
Glucose, Bld: 137 mg/dL — ABNORMAL HIGH (ref 70–99)
Potassium: 4.8 mmol/L (ref 3.5–5.1)
Sodium: 138 mmol/L (ref 135–145)

## 2022-06-03 LAB — POCT I-STAT 7, (LYTES, BLD GAS, ICA,H+H)
Acid-Base Excess: 1 mmol/L (ref 0.0–2.0)
Acid-Base Excess: 2 mmol/L (ref 0.0–2.0)
Acid-base deficit: 3 mmol/L — ABNORMAL HIGH (ref 0.0–2.0)
Bicarbonate: 26.5 mmol/L (ref 20.0–28.0)
Bicarbonate: 26.3 mmol/L (ref 20.0–28.0)
Bicarbonate: 21.9 mmol/L (ref 20.0–28.0)
Calcium, Ion: 1.07 mmol/L — ABNORMAL LOW (ref 1.15–1.40)
Calcium, Ion: 1.11 mmol/L — ABNORMAL LOW (ref 1.15–1.40)
Calcium, Ion: 1.29 mmol/L (ref 1.15–1.40)
HCT: 26 % — ABNORMAL LOW (ref 39.0–52.0)
HCT: 26 % — ABNORMAL LOW (ref 39.0–52.0)
HCT: 25 % — ABNORMAL LOW (ref 39.0–52.0)
Hemoglobin: 8.8 g/dL — ABNORMAL LOW (ref 13.0–17.0)
Hemoglobin: 8.8 g/dL — ABNORMAL LOW (ref 13.0–17.0)
Hemoglobin: 8.5 g/dL — ABNORMAL LOW (ref 13.0–17.0)
O2 Saturation: 100 %
O2 Saturation: 100 %
O2 Saturation: 98 %
Patient temperature: 36.2
Potassium: 4 mmol/L (ref 3.5–5.1)
Potassium: 4.9 mmol/L (ref 3.5–5.1)
Potassium: 4.3 mmol/L (ref 3.5–5.1)
Sodium: 139 mmol/L (ref 135–145)
Sodium: 138 mmol/L (ref 135–145)
Sodium: 140 mmol/L (ref 135–145)
TCO2: 28 mmol/L (ref 22–32)
TCO2: 28 mmol/L (ref 22–32)
TCO2: 23 mmol/L (ref 22–32)
pCO2 arterial: 45.2 mmHg (ref 32–48)
pCO2 arterial: 41.5 mmHg (ref 32–48)
pCO2 arterial: 36.4 mmHg (ref 32–48)
pH, Arterial: 7.377 (ref 7.35–7.45)
pH, Arterial: 7.41 (ref 7.35–7.45)
pH, Arterial: 7.383 (ref 7.35–7.45)
pO2, Arterial: 295 mmHg — ABNORMAL HIGH (ref 83–108)
pO2, Arterial: 348 mmHg — ABNORMAL HIGH (ref 83–108)
pO2, Arterial: 103 mmHg (ref 83–108)

## 2022-06-03 LAB — POCT I-STAT EG7
Acid-Base Excess: 1 mmol/L (ref 0.0–2.0)
Bicarbonate: 26.1 mmol/L (ref 20.0–28.0)
Calcium, Ion: 1.1 mmol/L — ABNORMAL LOW (ref 1.15–1.40)
HCT: 25 % — ABNORMAL LOW (ref 39.0–52.0)
Hemoglobin: 8.5 g/dL — ABNORMAL LOW (ref 13.0–17.0)
O2 Saturation: 82 %
Potassium: 4.8 mmol/L (ref 3.5–5.1)
Sodium: 138 mmol/L (ref 135–145)
TCO2: 27 mmol/L (ref 22–32)
pCO2, Ven: 46.1 mmHg (ref 44–60)
pH, Ven: 7.361 (ref 7.25–7.43)
pO2, Ven: 49 mmHg — ABNORMAL HIGH (ref 32–45)

## 2022-06-03 LAB — POCT I-STAT, CHEM 8
BUN: 26 mg/dL — ABNORMAL HIGH (ref 8–23)
BUN: 27 mg/dL — ABNORMAL HIGH (ref 8–23)
BUN: 26 mg/dL — ABNORMAL HIGH (ref 8–23)
BUN: 27 mg/dL — ABNORMAL HIGH (ref 8–23)
BUN: 29 mg/dL — ABNORMAL HIGH (ref 8–23)
Calcium, Ion: 1.17 mmol/L (ref 1.15–1.40)
Calcium, Ion: 1.2 mmol/L (ref 1.15–1.40)
Calcium, Ion: 1.09 mmol/L — ABNORMAL LOW (ref 1.15–1.40)
Calcium, Ion: 1.11 mmol/L — ABNORMAL LOW (ref 1.15–1.40)
Calcium, Ion: 1.36 mmol/L (ref 1.15–1.40)
Chloride: 103 mmol/L (ref 98–111)
Chloride: 103 mmol/L (ref 98–111)
Chloride: 102 mmol/L (ref 98–111)
Chloride: 103 mmol/L (ref 98–111)
Chloride: 104 mmol/L (ref 98–111)
Creatinine, Ser: 1.5 mg/dL — ABNORMAL HIGH (ref 0.61–1.24)
Creatinine, Ser: 1.4 mg/dL — ABNORMAL HIGH (ref 0.61–1.24)
Creatinine, Ser: 1.3 mg/dL — ABNORMAL HIGH (ref 0.61–1.24)
Creatinine, Ser: 1.3 mg/dL — ABNORMAL HIGH (ref 0.61–1.24)
Creatinine, Ser: 1.3 mg/dL — ABNORMAL HIGH (ref 0.61–1.24)
Glucose, Bld: 99 mg/dL (ref 70–99)
Glucose, Bld: 85 mg/dL (ref 70–99)
Glucose, Bld: 100 mg/dL — ABNORMAL HIGH (ref 70–99)
Glucose, Bld: 159 mg/dL — ABNORMAL HIGH (ref 70–99)
Glucose, Bld: 137 mg/dL — ABNORMAL HIGH (ref 70–99)
HCT: 31 % — ABNORMAL LOW (ref 39.0–52.0)
HCT: 33 % — ABNORMAL LOW (ref 39.0–52.0)
HCT: 26 % — ABNORMAL LOW (ref 39.0–52.0)
HCT: 26 % — ABNORMAL LOW (ref 39.0–52.0)
HCT: 26 % — ABNORMAL LOW (ref 39.0–52.0)
Hemoglobin: 10.5 g/dL — ABNORMAL LOW (ref 13.0–17.0)
Hemoglobin: 11.2 g/dL — ABNORMAL LOW (ref 13.0–17.0)
Hemoglobin: 8.8 g/dL — ABNORMAL LOW (ref 13.0–17.0)
Hemoglobin: 8.8 g/dL — ABNORMAL LOW (ref 13.0–17.0)
Hemoglobin: 8.8 g/dL — ABNORMAL LOW (ref 13.0–17.0)
Potassium: 4 mmol/L (ref 3.5–5.1)
Potassium: 4.2 mmol/L (ref 3.5–5.1)
Potassium: 4.6 mmol/L (ref 3.5–5.1)
Potassium: 5.2 mmol/L — ABNORMAL HIGH (ref 3.5–5.1)
Potassium: 4.7 mmol/L (ref 3.5–5.1)
Sodium: 139 mmol/L (ref 135–145)
Sodium: 139 mmol/L (ref 135–145)
Sodium: 138 mmol/L (ref 135–145)
Sodium: 138 mmol/L (ref 135–145)
Sodium: 138 mmol/L (ref 135–145)
TCO2: 23 mmol/L (ref 22–32)
TCO2: 22 mmol/L (ref 22–32)
TCO2: 24 mmol/L (ref 22–32)
TCO2: 26 mmol/L (ref 22–32)
TCO2: 23 mmol/L (ref 22–32)

## 2022-06-03 LAB — CBC
HCT: 28.8 % — ABNORMAL LOW (ref 39.0–52.0)
HCT: 30.6 % — ABNORMAL LOW (ref 39.0–52.0)
Hemoglobin: 10 g/dL — ABNORMAL LOW (ref 13.0–17.0)
Hemoglobin: 10.5 g/dL — ABNORMAL LOW (ref 13.0–17.0)
MCH: 33.4 pg (ref 26.0–34.0)
MCH: 32.9 pg (ref 26.0–34.0)
MCHC: 34.7 g/dL (ref 30.0–36.0)
MCHC: 34.3 g/dL (ref 30.0–36.0)
MCV: 96.3 fL (ref 80.0–100.0)
MCV: 95.9 fL (ref 80.0–100.0)
Platelets: 141 10*3/uL — ABNORMAL LOW (ref 150–400)
Platelets: 180 10*3/uL (ref 150–400)
RBC: 2.99 MIL/uL — ABNORMAL LOW (ref 4.22–5.81)
RBC: 3.19 MIL/uL — ABNORMAL LOW (ref 4.22–5.81)
RDW: 12.6 % (ref 11.5–15.5)
RDW: 12.8 % (ref 11.5–15.5)
WBC: 6.7 10*3/uL (ref 4.0–10.5)
WBC: 10.7 10*3/uL — ABNORMAL HIGH (ref 4.0–10.5)
nRBC: 0 % (ref 0.0–0.2)
nRBC: 0 % (ref 0.0–0.2)

## 2022-06-03 LAB — BLOOD GAS, ARTERIAL
Acid-Base Excess: 1.5 mmol/L (ref 0.0–2.0)
Bicarbonate: 27.2 mmol/L (ref 20.0–28.0)
O2 Saturation: 99.6 %
Patient temperature: 37
pCO2 arterial: 46 mmHg (ref 32–48)
pH, Arterial: 7.38 (ref 7.35–7.45)
pO2, Arterial: 128 mmHg — ABNORMAL HIGH (ref 83–108)

## 2022-06-03 LAB — ECHO INTRAOPERATIVE TEE
Height: 69 in
S' Lateral: 2.9 cm
Weight: 2928 oz

## 2022-06-03 LAB — GLUCOSE, CAPILLARY
Glucose-Capillary: 110 mg/dL — ABNORMAL HIGH (ref 70–99)
Glucose-Capillary: 128 mg/dL — ABNORMAL HIGH (ref 70–99)
Glucose-Capillary: 94 mg/dL (ref 70–99)
Glucose-Capillary: 126 mg/dL — ABNORMAL HIGH (ref 70–99)
Glucose-Capillary: 123 mg/dL — ABNORMAL HIGH (ref 70–99)
Glucose-Capillary: 133 mg/dL — ABNORMAL HIGH (ref 70–99)
Glucose-Capillary: 129 mg/dL — ABNORMAL HIGH (ref 70–99)
Glucose-Capillary: 117 mg/dL — ABNORMAL HIGH (ref 70–99)
Glucose-Capillary: 158 mg/dL — ABNORMAL HIGH (ref 70–99)

## 2022-06-03 LAB — MAGNESIUM: Magnesium: 3.4 mg/dL — ABNORMAL HIGH (ref 1.7–2.4)

## 2022-06-03 LAB — ABO/RH: ABO/RH(D): O POS

## 2022-06-03 LAB — PLATELET COUNT: Platelets: 161 10*3/uL (ref 150–400)

## 2022-06-03 LAB — HEMOGLOBIN AND HEMATOCRIT, BLOOD
HCT: 25.6 % — ABNORMAL LOW (ref 39.0–52.0)
Hemoglobin: 9.2 g/dL — ABNORMAL LOW (ref 13.0–17.0)

## 2022-06-03 LAB — APTT: aPTT: 33 seconds (ref 24–36)

## 2022-06-03 LAB — PROTIME-INR
INR: 1.4 — ABNORMAL HIGH (ref 0.8–1.2)
Prothrombin Time: 17.2 seconds — ABNORMAL HIGH (ref 11.4–15.2)

## 2022-06-03 SURGERY — CORONARY ARTERY BYPASS GRAFTING (CABG)
Anesthesia: General | Site: Chest

## 2022-06-03 MED ORDER — CEFAZOLIN SODIUM-DEXTROSE 2-4 GM/100ML-% IV SOLN
2.0000 g | Freq: Three times a day (TID) | INTRAVENOUS | Status: AC
  Administered 2022-06-03 – 2022-06-05 (×6): 2 g via INTRAVENOUS
  Filled 2022-06-03 (×6): qty 100

## 2022-06-03 MED ORDER — PROTAMINE SULFATE 10 MG/ML IV SOLN
INTRAVENOUS | Status: DC | PRN
  Administered 2022-06-03: 250 mg via INTRAVENOUS

## 2022-06-03 MED ORDER — OXYCODONE HCL 5 MG PO TABS
5.0000 mg | ORAL_TABLET | ORAL | Status: DC | PRN
  Administered 2022-06-04: 10 mg via ORAL
  Administered 2022-06-04: 5 mg via ORAL
  Administered 2022-06-04 – 2022-06-05 (×3): 10 mg via ORAL
  Administered 2022-06-06: 5 mg via ORAL
  Administered 2022-06-06 – 2022-06-08 (×2): 10 mg via ORAL
  Filled 2022-06-03 (×4): qty 2
  Filled 2022-06-03: qty 1
  Filled 2022-06-03 (×3): qty 2

## 2022-06-03 MED ORDER — SODIUM CHLORIDE 0.9% FLUSH: 10.0000 mL | INTRAVENOUS | Status: DC | PRN

## 2022-06-03 MED ORDER — ONDANSETRON HCL 4 MG/2ML IJ SOLN: 4.0000 mg | Freq: Four times a day (QID) | INTRAMUSCULAR | Status: DC | PRN

## 2022-06-03 MED ORDER — LACTATED RINGERS IV SOLN: 500.0000 mL | Freq: Once | INTRAVENOUS | Status: DC | PRN

## 2022-06-03 MED ORDER — VANCOMYCIN HCL IN DEXTROSE 1-5 GM/200ML-% IV SOLN
1000.0000 mg | Freq: Once | INTRAVENOUS | Status: AC
  Administered 2022-06-03: 1000 mg via INTRAVENOUS
  Filled 2022-06-03: qty 200

## 2022-06-03 MED ORDER — SODIUM CHLORIDE 0.9 % IV SOLN: INTRAVENOUS | Status: DC | PRN

## 2022-06-03 MED ORDER — FENTANYL CITRATE (PF) 250 MCG/5ML IJ SOLN
INTRAMUSCULAR | Status: AC
  Filled 2022-06-03: qty 5

## 2022-06-03 MED ORDER — LACTATED RINGERS IV SOLN: INTRAVENOUS | Status: DC

## 2022-06-03 MED ORDER — BISACODYL 10 MG RE SUPP: 10.0000 mg | Freq: Every day | RECTAL | Status: DC

## 2022-06-03 MED ORDER — PROTAMINE SULFATE 10 MG/ML IV SOLN
INTRAVENOUS | Status: AC
  Filled 2022-06-03: qty 5

## 2022-06-03 MED ORDER — ACETAMINOPHEN 500 MG PO TABS
1000.0000 mg | ORAL_TABLET | Freq: Four times a day (QID) | ORAL | Status: DC
  Administered 2022-06-04 – 2022-06-08 (×17): 1000 mg via ORAL
  Filled 2022-06-03 (×17): qty 2

## 2022-06-03 MED ORDER — ORAL CARE MOUTH RINSE: 15.0000 mL | OROMUCOSAL | Status: DC | PRN

## 2022-06-03 MED ORDER — LACTATED RINGERS IV SOLN: INTRAVENOUS | Status: DC | PRN

## 2022-06-03 MED ORDER — LACTATED RINGERS IV SOLN
INTRAVENOUS | Status: DC
Start: 2022-06-03 — End: 2022-06-08

## 2022-06-03 MED ORDER — SODIUM CHLORIDE (PF) 0.9 % IJ SOLN
INTRAMUSCULAR | Status: AC
Start: 2022-06-03 — End: ?
  Filled 2022-06-03: qty 10

## 2022-06-03 MED ORDER — METOPROLOL TARTRATE 12.5 MG HALF TABLET
12.5000 mg | ORAL_TABLET | Freq: Once | ORAL | Status: DC
  Filled 2022-06-03: qty 1

## 2022-06-03 MED ORDER — SODIUM CHLORIDE 0.45 % IV SOLN: INTRAVENOUS | Status: DC | PRN

## 2022-06-03 MED ORDER — ORAL CARE MOUTH RINSE
15.0000 mL | Freq: Once | OROMUCOSAL | Status: AC
Start: 2022-06-03 — End: 2022-06-03

## 2022-06-03 MED ORDER — CHLORHEXIDINE GLUCONATE 0.12 % MT SOLN
15.0000 mL | Freq: Once | OROMUCOSAL | Status: AC
Start: 2022-06-03 — End: 2022-06-03
  Administered 2022-06-03: 15 mL via OROMUCOSAL
  Filled 2022-06-03: qty 15

## 2022-06-03 MED ORDER — ATORVASTATIN CALCIUM 80 MG PO TABS
80.0000 mg | ORAL_TABLET | Freq: Every day | ORAL | Status: DC
  Administered 2022-06-04 – 2022-06-08 (×5): 80 mg via ORAL
  Filled 2022-06-03 (×5): qty 1

## 2022-06-03 MED ORDER — CHLORHEXIDINE GLUCONATE 0.12 % MT SOLN
15.0000 mL | OROMUCOSAL | Status: AC
  Administered 2022-06-03: 15 mL via OROMUCOSAL
  Filled 2022-06-03: qty 15

## 2022-06-03 MED ORDER — PROPOFOL 10 MG/ML IV BOLUS
INTRAVENOUS | Status: AC
Start: 2022-06-03 — End: ?
  Filled 2022-06-03: qty 20

## 2022-06-03 MED ORDER — ALBUMIN HUMAN 5 % IV SOLN: INTRAVENOUS | Status: DC | PRN

## 2022-06-03 MED ORDER — LIDOCAINE 2% (20 MG/ML) 5 ML SYRINGE
INTRAMUSCULAR | Status: AC
  Filled 2022-06-03: qty 5

## 2022-06-03 MED ORDER — NOREPINEPHRINE 4 MG/250ML-% IV SOLN: 0.0000 ug/min | INTRAVENOUS | Status: DC

## 2022-06-03 MED ORDER — FENTANYL CITRATE (PF) 250 MCG/5ML IJ SOLN
INTRAMUSCULAR | Status: DC | PRN
  Administered 2022-06-03: 50 ug via INTRAVENOUS
  Administered 2022-06-03: 100 ug via INTRAVENOUS
  Administered 2022-06-03 (×2): 50 ug via INTRAVENOUS
  Administered 2022-06-03 (×2): 100 ug via INTRAVENOUS
  Administered 2022-06-03: 50 ug via INTRAVENOUS
  Administered 2022-06-03: 100 ug via INTRAVENOUS
  Administered 2022-06-03: 200 ug via INTRAVENOUS
  Administered 2022-06-03: 100 ug via INTRAVENOUS
  Administered 2022-06-03 (×3): 50 ug via INTRAVENOUS
  Administered 2022-06-03: 100 ug via INTRAVENOUS
  Administered 2022-06-03 (×2): 50 ug via INTRAVENOUS

## 2022-06-03 MED ORDER — SODIUM CHLORIDE 0.9% FLUSH
3.0000 mL | INTRAVENOUS | Status: DC | PRN
Start: 2022-06-04 — End: 2022-06-08

## 2022-06-03 MED ORDER — DEXMEDETOMIDINE HCL IN NACL 400 MCG/100ML IV SOLN
0.0000 ug/kg/h | INTRAVENOUS | Status: DC
  Administered 2022-06-04: 0.2 ug/kg/h via INTRAVENOUS
  Filled 2022-06-03: qty 100

## 2022-06-03 MED ORDER — METOPROLOL TARTRATE 5 MG/5ML IV SOLN
2.5000 mg | INTRAVENOUS | Status: DC | PRN
  Administered 2022-06-04 – 2022-06-05 (×2): 5 mg via INTRAVENOUS
  Filled 2022-06-03 (×2): qty 5

## 2022-06-03 MED ORDER — ASPIRIN 81 MG PO CHEW: 324.0000 mg | CHEWABLE_TABLET | Freq: Every day | ORAL | Status: DC

## 2022-06-03 MED ORDER — PHENYLEPHRINE 80 MCG/ML (10ML) SYRINGE FOR IV PUSH (FOR BLOOD PRESSURE SUPPORT)
PREFILLED_SYRINGE | INTRAVENOUS | Status: AC
  Filled 2022-06-03: qty 10

## 2022-06-03 MED ORDER — NITROGLYCERIN IN D5W 200-5 MCG/ML-% IV SOLN: 0.0000 ug/min | INTRAVENOUS | Status: DC

## 2022-06-03 MED ORDER — MAGNESIUM SULFATE 4 GM/100ML IV SOLN
4.0000 g | Freq: Once | INTRAVENOUS | Status: AC
  Administered 2022-06-03: 4 g via INTRAVENOUS
  Filled 2022-06-03: qty 100

## 2022-06-03 MED ORDER — ACETAMINOPHEN 650 MG RE SUPP
650.0000 mg | Freq: Once | RECTAL | Status: AC
  Administered 2022-06-03: 650 mg via RECTAL

## 2022-06-03 MED ORDER — ORAL CARE MOUTH RINSE
15.0000 mL | OROMUCOSAL | Status: DC
  Administered 2022-06-03 – 2022-06-04 (×7): 15 mL via OROMUCOSAL

## 2022-06-03 MED ORDER — ONDANSETRON HCL 4 MG/2ML IJ SOLN
INTRAMUSCULAR | Status: DC | PRN
  Administered 2022-06-03: 4 mg via INTRAVENOUS

## 2022-06-03 MED ORDER — EPHEDRINE 5 MG/ML INJ
INTRAVENOUS | Status: AC
  Filled 2022-06-03: qty 5

## 2022-06-03 MED ORDER — BISACODYL 5 MG PO TBEC
10.0000 mg | DELAYED_RELEASE_TABLET | Freq: Every day | ORAL | Status: DC
  Administered 2022-06-04 – 2022-06-06 (×3): 10 mg via ORAL
  Filled 2022-06-03 (×4): qty 2

## 2022-06-03 MED ORDER — PROPOFOL 10 MG/ML IV BOLUS
INTRAVENOUS | Status: AC
  Filled 2022-06-03: qty 20

## 2022-06-03 MED ORDER — ALBUMIN HUMAN 5 % IV SOLN
250.0000 mL | INTRAVENOUS | Status: AC | PRN
  Administered 2022-06-03: 12.5 g via INTRAVENOUS

## 2022-06-03 MED ORDER — DOCUSATE SODIUM 100 MG PO CAPS
200.0000 mg | ORAL_CAPSULE | Freq: Every day | ORAL | Status: DC
  Administered 2022-06-04 – 2022-06-06 (×3): 200 mg via ORAL
  Filled 2022-06-03 (×4): qty 2

## 2022-06-03 MED ORDER — CHLORHEXIDINE GLUCONATE CLOTH 2 % EX PADS
6.0000 | MEDICATED_PAD | Freq: Every day | CUTANEOUS | Status: DC
  Administered 2022-06-03 – 2022-06-06 (×4): 6 via TOPICAL

## 2022-06-03 MED ORDER — DEXTROSE 50 % IV SOLN: 0.0000 mL | INTRAVENOUS | Status: DC | PRN

## 2022-06-03 MED ORDER — CHLORHEXIDINE GLUCONATE 0.12 % MT SOLN: 15.0000 mL | Freq: Once | OROMUCOSAL | Status: DC

## 2022-06-03 MED ORDER — MIDAZOLAM HCL (PF) 10 MG/2ML IJ SOLN
INTRAMUSCULAR | Status: AC
  Filled 2022-06-03: qty 2

## 2022-06-03 MED ORDER — ROCURONIUM BROMIDE 10 MG/ML (PF) SYRINGE
PREFILLED_SYRINGE | INTRAVENOUS | Status: DC | PRN
  Administered 2022-06-03: 100 mg via INTRAVENOUS
  Administered 2022-06-03: 50 mg via INTRAVENOUS
  Administered 2022-06-03: 30 mg via INTRAVENOUS
  Administered 2022-06-03: 20 mg via INTRAVENOUS

## 2022-06-03 MED ORDER — METOPROLOL TARTRATE 12.5 MG HALF TABLET
12.5000 mg | ORAL_TABLET | Freq: Two times a day (BID) | ORAL | Status: DC
Start: 2022-06-03 — End: 2022-06-04
  Administered 2022-06-04: 12.5 mg via ORAL
  Filled 2022-06-03: qty 1

## 2022-06-03 MED ORDER — HEPARIN SODIUM (PORCINE) 1000 UNIT/ML IJ SOLN
INTRAMUSCULAR | Status: AC
  Filled 2022-06-03: qty 1

## 2022-06-03 MED ORDER — ARTIFICIAL TEARS OPHTHALMIC OINT
TOPICAL_OINTMENT | OPHTHALMIC | Status: AC
  Filled 2022-06-03: qty 3.5

## 2022-06-03 MED ORDER — SODIUM CHLORIDE (PF) 0.9 % IJ SOLN
OROMUCOSAL | Status: DC | PRN
  Administered 2022-06-03 (×2): 4 mL via TOPICAL

## 2022-06-03 MED ORDER — MIDAZOLAM HCL 2 MG/2ML IJ SOLN: 2.0000 mg | INTRAMUSCULAR | Status: DC | PRN

## 2022-06-03 MED ORDER — NITROGLYCERIN 0.2 MG/ML ON CALL CATH LAB
INTRAVENOUS | Status: DC | PRN
  Administered 2022-06-03: 20 ug via INTRAVENOUS
  Administered 2022-06-03: 40 ug via INTRAVENOUS

## 2022-06-03 MED ORDER — PHENYLEPHRINE HCL-NACL 20-0.9 MG/250ML-% IV SOLN: 0.0000 ug/min | INTRAVENOUS | Status: DC

## 2022-06-03 MED ORDER — CHLORHEXIDINE GLUCONATE 4 % EX LIQD: 30.0000 mL | CUTANEOUS | Status: DC

## 2022-06-03 MED ORDER — ~~LOC~~ CARDIAC SURGERY, PATIENT & FAMILY EDUCATION
Freq: Once | Status: DC
  Filled 2022-06-03: qty 1

## 2022-06-03 MED ORDER — FAMOTIDINE IN NACL 20-0.9 MG/50ML-% IV SOLN
20.0000 mg | Freq: Two times a day (BID) | INTRAVENOUS | Status: AC
  Administered 2022-06-03 (×2): 20 mg via INTRAVENOUS
  Filled 2022-06-03 (×2): qty 50

## 2022-06-03 MED ORDER — ASPIRIN 325 MG PO TBEC
325.0000 mg | DELAYED_RELEASE_TABLET | Freq: Every day | ORAL | Status: DC
Start: 2022-06-04 — End: 2022-06-08
  Administered 2022-06-04 – 2022-06-08 (×5): 325 mg via ORAL
  Filled 2022-06-03 (×5): qty 1

## 2022-06-03 MED ORDER — ROCURONIUM BROMIDE 10 MG/ML (PF) SYRINGE
PREFILLED_SYRINGE | INTRAVENOUS | Status: AC
  Filled 2022-06-03: qty 10

## 2022-06-03 MED ORDER — PANTOPRAZOLE SODIUM 40 MG PO TBEC
40.0000 mg | DELAYED_RELEASE_TABLET | Freq: Every day | ORAL | Status: DC
Start: 2022-06-05 — End: 2022-06-08
  Administered 2022-06-05 – 2022-06-08 (×4): 40 mg via ORAL
  Filled 2022-06-03 (×4): qty 1

## 2022-06-03 MED ORDER — ACETAMINOPHEN 160 MG/5ML PO SOLN: 1000.0000 mg | Freq: Four times a day (QID) | ORAL | Status: DC

## 2022-06-03 MED ORDER — POTASSIUM CHLORIDE 10 MEQ/50ML IV SOLN: 10.0000 meq | INTRAVENOUS | Status: AC

## 2022-06-03 MED ORDER — SODIUM CHLORIDE 0.9% FLUSH
10.0000 mL | Freq: Two times a day (BID) | INTRAVENOUS | Status: DC
  Administered 2022-06-04: 40 mL
  Administered 2022-06-05: 10 mL

## 2022-06-03 MED ORDER — SODIUM CHLORIDE 0.9% FLUSH
3.0000 mL | Freq: Two times a day (BID) | INTRAVENOUS | Status: DC
  Administered 2022-06-04 – 2022-06-07 (×4): 3 mL via INTRAVENOUS

## 2022-06-03 MED ORDER — HEMOSTATIC AGENTS (NO CHARGE) OPTIME
TOPICAL | Status: DC | PRN
  Administered 2022-06-03: 1 via TOPICAL

## 2022-06-03 MED ORDER — ACETAMINOPHEN 500 MG PO TABS
1000.0000 mg | ORAL_TABLET | Freq: Once | ORAL | Status: AC
  Administered 2022-06-03: 1000 mg via ORAL
  Filled 2022-06-03: qty 2

## 2022-06-03 MED ORDER — PROPOFOL 10 MG/ML IV BOLUS
INTRAVENOUS | Status: DC | PRN
  Administered 2022-06-03: 50 mg via INTRAVENOUS
  Administered 2022-06-03: 40 mg via INTRAVENOUS

## 2022-06-03 MED ORDER — MIDAZOLAM HCL (PF) 5 MG/ML IJ SOLN
INTRAMUSCULAR | Status: DC | PRN
  Administered 2022-06-03: 1 mg via INTRAVENOUS
  Administered 2022-06-03 (×2): 2 mg via INTRAVENOUS
  Administered 2022-06-03: 1 mg via INTRAVENOUS
  Administered 2022-06-03: 2 mg via INTRAVENOUS

## 2022-06-03 MED ORDER — SODIUM CHLORIDE 0.9 % IV SOLN
INTRAVENOUS | Status: DC
Start: 2022-06-03 — End: 2022-06-08

## 2022-06-03 MED ORDER — 0.9 % SODIUM CHLORIDE (POUR BTL) OPTIME
TOPICAL | Status: DC | PRN
  Administered 2022-06-03: 5000 mL

## 2022-06-03 MED ORDER — HEPARIN SODIUM (PORCINE) 1000 UNIT/ML IJ SOLN
INTRAMUSCULAR | Status: DC | PRN
  Administered 2022-06-03: 27000 [IU] via INTRAVENOUS

## 2022-06-03 MED ORDER — TRAMADOL HCL 50 MG PO TABS
50.0000 mg | ORAL_TABLET | ORAL | Status: DC | PRN
  Administered 2022-06-03 – 2022-06-06 (×3): 100 mg via ORAL
  Filled 2022-06-03 (×3): qty 2

## 2022-06-03 MED ORDER — SODIUM CHLORIDE 0.9 % IV SOLN: 250.0000 mL | INTRAVENOUS | Status: DC

## 2022-06-03 MED ORDER — ACETAMINOPHEN 160 MG/5ML PO SOLN: 650.0000 mg | Freq: Once | ORAL | Status: AC

## 2022-06-03 MED ORDER — MORPHINE SULFATE (PF) 2 MG/ML IV SOLN
1.0000 mg | INTRAVENOUS | Status: DC | PRN
  Administered 2022-06-03: 2 mg via INTRAVENOUS
  Administered 2022-06-03: 4 mg via INTRAVENOUS
  Administered 2022-06-03: 2 mg via INTRAVENOUS
  Administered 2022-06-03: 4 mg via INTRAVENOUS
  Administered 2022-06-04: 2 mg via INTRAVENOUS
  Administered 2022-06-04: 4 mg via INTRAVENOUS
  Administered 2022-06-04 – 2022-06-05 (×8): 2 mg via INTRAVENOUS
  Filled 2022-06-03 (×2): qty 2
  Filled 2022-06-03 (×9): qty 1
  Filled 2022-06-03: qty 2
  Filled 2022-06-03 (×2): qty 1

## 2022-06-03 MED ORDER — METOPROLOL TARTRATE 25 MG/10 ML ORAL SUSPENSION: 12.5000 mg | Freq: Two times a day (BID) | ORAL | Status: DC

## 2022-06-03 MED ORDER — DEXAMETHASONE SODIUM PHOSPHATE 10 MG/ML IJ SOLN
INTRAMUSCULAR | Status: AC
  Filled 2022-06-03: qty 1

## 2022-06-03 MED ORDER — INSULIN REGULAR(HUMAN) IN NACL 100-0.9 UT/100ML-% IV SOLN: INTRAVENOUS | Status: DC

## 2022-06-03 MED FILL — Lidocaine HCl Local Preservative Free (PF) Inj 2%: INTRAMUSCULAR | Qty: 15 | Status: AC

## 2022-06-03 MED FILL — Potassium Chloride Inj 2 mEq/ML: INTRAVENOUS | Qty: 40 | Status: AC

## 2022-06-03 MED FILL — Heparin Sodium (Porcine) Inj 1000 Unit/ML: INTRAMUSCULAR | Qty: 2500 | Status: AC

## 2022-06-03 MED FILL — Heparin Sodium (Porcine) Inj 1000 Unit/ML: Qty: 1000 | Status: AC

## 2022-06-03 SURGICAL SUPPLY — 85 items
BAG DECANTER FOR FLEXI CONT (MISCELLANEOUS) ×3 IMPLANT
BLADE CLIPPER SURG (BLADE) ×3 IMPLANT
BLADE STERNUM SYSTEM 6 (BLADE) ×3 IMPLANT
BNDG ELASTIC 4X5.8 VLCR STR LF (GAUZE/BANDAGES/DRESSINGS) ×3 IMPLANT
BNDG ELASTIC 6X5.8 VLCR STR LF (GAUZE/BANDAGES/DRESSINGS) ×3 IMPLANT
BNDG GAUZE ELAST 4 BULKY (GAUZE/BANDAGES/DRESSINGS) ×3 IMPLANT
CABLE SURGICAL S-101-97-12 (CABLE) ×3 IMPLANT
CANISTER SUCT 3000ML PPV (MISCELLANEOUS) ×3 IMPLANT
CANNULA MC2 2 STG 29/37 NON-V (CANNULA) ×2 IMPLANT
CANNULA MC2 TWO STAGE (CANNULA) ×1
CANNULA NON VENT 20FR 12 (CANNULA) ×3 IMPLANT
CATH ROBINSON RED A/P 18FR (CATHETERS) ×6 IMPLANT
CLIP RETRACTION 3.0MM CORONARY (MISCELLANEOUS) ×3 IMPLANT
CLIP VESOCCLUDE MED 24/CT (CLIP) IMPLANT
CLIP VESOCCLUDE SM WIDE 24/CT (CLIP) IMPLANT
CONN ST 1/2X1/2  BEN (MISCELLANEOUS) ×1
CONN ST 1/2X1/2 BEN (MISCELLANEOUS) ×2 IMPLANT
CONNECTOR BLAKE 2:1 CARIO BLK (MISCELLANEOUS) ×3 IMPLANT
CONTAINER PROTECT SURGISLUSH (MISCELLANEOUS) ×6 IMPLANT
DERMABOND ADVANCED (GAUZE/BANDAGES/DRESSINGS) ×1
DERMABOND ADVANCED .7 DNX12 (GAUZE/BANDAGES/DRESSINGS) IMPLANT
DRAIN CHANNEL 19F RND (DRAIN) ×9 IMPLANT
DRAIN CONNECTOR BLAKE 1:1 (MISCELLANEOUS) ×3 IMPLANT
DRAPE CARDIOVASCULAR INCISE (DRAPES) ×1
DRAPE INCISE IOBAN 66X45 STRL (DRAPES) IMPLANT
DRAPE SRG 135X102X78XABS (DRAPES) ×2 IMPLANT
DRAPE WARM FLUID 44X44 (DRAPES) ×3 IMPLANT
DRSG AQUACEL AG ADV 3.5X10 (GAUZE/BANDAGES/DRESSINGS) ×3 IMPLANT
DRSG COVADERM 4X14 (GAUZE/BANDAGES/DRESSINGS) ×3 IMPLANT
ELECT BLADE 4.0 EZ CLEAN MEGAD (MISCELLANEOUS) ×6
ELECT REM PT RETURN 9FT ADLT (ELECTROSURGICAL) ×6
ELECTRODE BLDE 4.0 EZ CLN MEGD (MISCELLANEOUS) ×2 IMPLANT
ELECTRODE REM PT RTRN 9FT ADLT (ELECTROSURGICAL) ×4 IMPLANT
FELT TEFLON 1X6 (MISCELLANEOUS) ×6 IMPLANT
GAUZE 4X4 16PLY ~~LOC~~+RFID DBL (SPONGE) ×3 IMPLANT
GAUZE SPONGE 4X4 12PLY STRL (GAUZE/BANDAGES/DRESSINGS) ×6 IMPLANT
GLOVE BIO SURGEON STRL SZ7 (GLOVE) ×6 IMPLANT
GLOVE BIOGEL M STRL SZ7.5 (GLOVE) ×6 IMPLANT
GOWN STRL REUS W/ TWL LRG LVL3 (GOWN DISPOSABLE) ×8 IMPLANT
GOWN STRL REUS W/ TWL XL LVL3 (GOWN DISPOSABLE) ×4 IMPLANT
GOWN STRL REUS W/TWL LRG LVL3 (GOWN DISPOSABLE) ×4
GOWN STRL REUS W/TWL XL LVL3 (GOWN DISPOSABLE) ×2
HEMOSTAT POWDER SURGIFOAM 1G (HEMOSTASIS) ×9 IMPLANT
HEMOSTAT SURGICEL 2X14 (HEMOSTASIS) ×1 IMPLANT
INSERT SUTURE HOLDER (MISCELLANEOUS) ×3 IMPLANT
KIT BASIN OR (CUSTOM PROCEDURE TRAY) ×3 IMPLANT
KIT SUCTION CATH 14FR (SUCTIONS) ×3 IMPLANT
KIT TURNOVER KIT B (KITS) ×3 IMPLANT
KIT VASOVIEW HEMOPRO 2 VH 4000 (KITS) ×3 IMPLANT
LEAD PACING MYOCARDI (MISCELLANEOUS) ×3 IMPLANT
MARKER GRAFT CORONARY BYPASS (MISCELLANEOUS) ×9 IMPLANT
NS IRRIG 1000ML POUR BTL (IV SOLUTION) ×15 IMPLANT
PACK ACCESSORY CANNULA KIT (KITS) ×3 IMPLANT
PACK E OPEN HEART (SUTURE) ×3 IMPLANT
PACK OPEN HEART (CUSTOM PROCEDURE TRAY) ×3 IMPLANT
PAD ARMBOARD 7.5X6 YLW CONV (MISCELLANEOUS) ×6 IMPLANT
PAD ELECT DEFIB RADIOL ZOLL (MISCELLANEOUS) ×3 IMPLANT
PENCIL BUTTON HOLSTER BLD 10FT (ELECTRODE) ×4 IMPLANT
POSITIONER HEAD DONUT 9IN (MISCELLANEOUS) ×3 IMPLANT
PUNCH AORTIC ROTATE 4.0MM (MISCELLANEOUS) ×3 IMPLANT
SET MPS 3-ND DEL (MISCELLANEOUS) ×1 IMPLANT
SPONGE T-LAP 18X18 ~~LOC~~+RFID (SPONGE) ×12 IMPLANT
SUPPORT HEART JANKE-BARRON (MISCELLANEOUS) ×3 IMPLANT
SUT BONE WAX W31G (SUTURE) ×3 IMPLANT
SUT ETHIBOND X763 2 0 SH 1 (SUTURE) ×6 IMPLANT
SUT MNCRL AB 3-0 PS2 18 (SUTURE) ×6 IMPLANT
SUT PDS AB 1 CTX 36 (SUTURE) ×7 IMPLANT
SUT PROLENE 4 0 RB 1 (SUTURE) ×1
SUT PROLENE 4 0 SH DA (SUTURE) ×3 IMPLANT
SUT PROLENE 4-0 RB1 .5 CRCL 36 (SUTURE) IMPLANT
SUT PROLENE 5 0 C 1 36 (SUTURE) ×9 IMPLANT
SUT PROLENE 7 0 BV 1 (SUTURE) ×4 IMPLANT
SUT PROLENE 7 0 BV1 MDA (SUTURE) ×4 IMPLANT
SUT STEEL 6MS V (SUTURE) ×6 IMPLANT
SUT VIC AB 1 CTX 27 (SUTURE) ×1 IMPLANT
SUT VIC AB 3-0 X1 27 (SUTURE) ×1 IMPLANT
SYSTEM SAHARA CHEST DRAIN ATS (WOUND CARE) ×3 IMPLANT
TAPE CLOTH SURG 4X10 WHT LF (GAUZE/BANDAGES/DRESSINGS) ×1 IMPLANT
TAPE PAPER 2X10 WHT MICROPORE (GAUZE/BANDAGES/DRESSINGS) ×1 IMPLANT
TOWEL GREEN STERILE (TOWEL DISPOSABLE) ×3 IMPLANT
TOWEL GREEN STERILE FF (TOWEL DISPOSABLE) ×3 IMPLANT
TRAY FOLEY SLVR 16FR TEMP STAT (SET/KITS/TRAYS/PACK) ×3 IMPLANT
TUBING LAP HI FLOW INSUFFLATIO (TUBING) ×3 IMPLANT
UNDERPAD 30X36 HEAVY ABSORB (UNDERPADS AND DIAPERS) ×3 IMPLANT
WATER STERILE IRR 1000ML POUR (IV SOLUTION) ×6 IMPLANT

## 2022-06-03 NOTE — Hospital Course (Addendum)
Referring: [name], MD Primary Care: Christena Flake, MD  History of Present Illness:     71 year old male presents for surgical evaluation three-vessel coronary artery disease.  Over the last year he has had worsening exertional shortness of breath and dyspnea.  He was evaluated by cardiology and underwent a left heart cath which showed multivessel coronary artery disease.  He denies any palpitations, orthopnea, or lower extremity swelling.  He has had a rod placed in his right thigh along with fusion of his right ankle. His echocardiogram shows preserved biventricular function and no significant valvular disease.  On review of his left heart catheterization, he has tandem lesions in the circumflex and required 2 grafts on that we will, his LAD is diffusely diseased, but there is a good distal target, and his PDA also appears graftable.  The risks and benefits of four-vessel CABG were discussed, and he is agreeable to proceed.  Hospital Course: Dustin Goodwin was admitted for elective surgery on 06/03/2022.  He was taken to the operating room where four-vessel coronary bypass grafting was carried out.  The left internal mammary artery was grafted to the left anterior descending coronary artery.  Saphenous veins were placed to the first and third obtuse marginal coronary arteries and to the posterior descending coronary artery.  Following the procedure, he separated from cardiopulmonary bypass without difficulty.  He was transferred to surgical ICU in stable condition. Vital signs and hemodynamics remained stable.  He was weaned from the ventilator and extubated routinely during the early evening after surgery.  He had a brief episode of SVT in the early morning hours of postop day 1.  This was treated with IV metoprolol with prompt resolution and no further recurrence.  Neo-Synephrine was utilized early postoperatively to support blood pressure but this was weaned off on the first postoperative  day.  Glucose was closely monitored and hyperglycemia was managed with an insulin drip early postoperatively.  He was transitioned to sliding scale insulin and Levemir on postop day 1.  He had a slight rise in his creatinine to 2.0 early postoperatively that was not unexpected given his history of early stage CKD.  By the second postoperative day, the creatinine was trending back down.  The monitoring lines were removed.  He was mobilized and progressed well.  By the second postoperative day, the chest tube drainage had subsided and so the chest tubes were removed along with the Cordis introducer.  On postoperative day #2 he did develop postoperative atrial fibrillation but has subsequently been chemically cardioverted to sinus rhythm with amiodarone.  He was transferred to [facility] where he has continued to make good progress.

## 2022-06-03 NOTE — Progress Notes (Signed)
COVID test negative on 05/29/22. Per Dr. Aleene Davidson, no other test is needed.

## 2022-06-03 NOTE — Progress Notes (Signed)
      [address]       [place] [address]             [phone]      S/p CABG x 4  BP 125/86   Pulse 73   Temp 97.9 F (36.6 C) (Oral)   Resp 13   Ht 5\' 9"  (1.753 m)   Wt 83 kg   SpO2 100%   BMI 27.02 kg/m  CVP= 9 CI = 2.4 by Flotrack  Intake/Output Summary (Last 24 hours) at 06/03/2022 1646 Last data filed at 06/03/2022 1600 Gross per 24 hour  Intake 3189.8 ml  Output 2118 ml  Net 1071.8 ml   Minimal CT output K= 4.3 Hct= 25  Weaning in progress  Doing well early postop  2119 C. Viviann Spare, MD [facility] [phone]

## 2022-06-03 NOTE — Anesthesia Procedure Notes (Signed)
Procedure Name: Intubation Date/Time: 06/03/2022 9:00 AM  Performed by: Kyung Rudd, CRNAPre-anesthesia Checklist: Patient identified, Emergency Drugs available, Suction available and Patient being monitored Patient Re-evaluated:Patient Re-evaluated prior to induction Oxygen Delivery Method: Circle system utilized Preoxygenation: Pre-oxygenation with 100% oxygen Induction Type: IV induction Ventilation: Mask ventilation without difficulty Laryngoscope Size: Mac and 4 Grade View: Grade I Tube type: Oral Tube size: 8.0 mm Number of attempts: 1 Airway Equipment and Method: Stylet Placement Confirmation: ETT inserted through vocal cords under direct vision, positive ETCO2 and breath sounds checked- equal and bilateral Secured at: 23 cm Tube secured with: Tape Dental Injury: Teeth and Oropharynx as per pre-operative assessment

## 2022-06-03 NOTE — Plan of Care (Signed)
  Problem: Education: Goal: Understanding of CV disease, CV risk reduction, and recovery process will improve Outcome: Progressing Goal: Individualized Educational Video(s) Outcome: Progressing   Problem: Activity: Goal: Ability to return to baseline activity level will improve Outcome: Progressing   Problem: Cardiovascular: Goal: Ability to achieve and maintain adequate cardiovascular perfusion will improve Outcome: Progressing Goal: Vascular access site(s) Level 0-1 will be maintained Outcome: Progressing   Problem: Health Behavior/Discharge Planning: Goal: Ability to safely manage health-related needs after discharge will improve Outcome: Progressing   Problem: Education: Goal: Will demonstrate proper wound care and an understanding of methods to prevent future damage Outcome: Progressing Goal: Knowledge of disease or condition will improve Outcome: Progressing Goal: Knowledge of the prescribed therapeutic regimen will improve Outcome: Progressing Goal: Individualized Educational Video(s) Outcome: Progressing   Problem: Activity: Goal: Risk for activity intolerance will decrease Outcome: Progressing   Problem: Cardiac: Goal: Will achieve and/or maintain hemodynamic stability Outcome: Progressing   Problem: Clinical Measurements: Goal: Postoperative complications will be avoided or minimized Outcome: Progressing   Problem: Respiratory: Goal: Respiratory status will improve Outcome: Progressing   Problem: Skin Integrity: Goal: Wound healing without signs and symptoms of infection Outcome: Progressing Goal: Risk for impaired skin integrity will decrease Outcome: Progressing   Problem: Urinary Elimination: Goal: Ability to achieve and maintain adequate renal perfusion and functioning will improve Outcome: Progressing   Problem: Education: Goal: Knowledge of General Education information will improve Description: Including pain rating scale, medication(s)/side  effects and non-pharmacologic comfort measures Outcome: Progressing   Problem: Health Behavior/Discharge Planning: Goal: Ability to manage health-related needs will improve Outcome: Progressing   Problem: Clinical Measurements: Goal: Ability to maintain clinical measurements within normal limits will improve Outcome: Progressing Goal: Will remain free from infection Outcome: Progressing Goal: Diagnostic test results will improve Outcome: Progressing Goal: Respiratory complications will improve Outcome: Progressing Goal: Cardiovascular complication will be avoided Outcome: Progressing   Problem: Activity: Goal: Risk for activity intolerance will decrease Outcome: Progressing   Problem: Nutrition: Goal: Adequate nutrition will be maintained Outcome: Progressing   Problem: Coping: Goal: Level of anxiety will decrease Outcome: Progressing   Problem: Elimination: Goal: Will not experience complications related to bowel motility Outcome: Progressing Goal: Will not experience complications related to urinary retention Outcome: Progressing   Problem: Pain Managment: Goal: General experience of comfort will improve Outcome: Progressing   Problem: Safety: Goal: Ability to remain free from injury will improve Outcome: Progressing   Problem: Skin Integrity: Goal: Risk for impaired skin integrity will decrease Outcome: Progressing   

## 2022-06-03 NOTE — Anesthesia Procedure Notes (Signed)
Arterial Line Insertion Start/End7/10/2022 7:42 AM Performed by: Gaynelle Adu, MD, Quentin Ore, CRNA, anesthesiologist  Patient location: Pre-op. Preanesthetic checklist: patient identified, IV checked, site marked, risks and benefits discussed, surgical consent, monitors and equipment checked, pre-op evaluation, timeout performed and anesthesia consent Lidocaine 1% used for infiltration and patient sedated Left, radial was placed Catheter size: 20 G Hand hygiene performed , maximum sterile barriers used  and Seldinger technique used  Attempts: 1 Procedure performed without using ultrasound guided technique. Following insertion, dressing applied and Biopatch. Post procedure assessment: normal  Patient tolerated the procedure well with no immediate complications.

## 2022-06-03 NOTE — Op Note (Signed)
[address]       [place] [address]             [phone]                                          06/03/2022 Patient:  Dustin Goodwin Pre-Op Dx: Three-vessel coronary artery disease   Hypertension   Hyperlipidemia   Diabetes mellitus Post-op Dx: Same Procedure: CABG X 4.  LIMA to LAD, reverse saphenous vein graft to PDA, OM 3, OM1 (jump off the hood of the OM 3 vein graft) Endoscopic greater saphenous vein harvest on the left   Surgeon and Role:      * Gaspar Fowle, Eliezer Lofts, MD - Primary    *M.  Roddenberry, PA-C- assisting An experienced assistant was required given the complexity of this surgery and the standard of surgical care. The assistant was needed for exposure, dissection, suctioning, retraction of delicate tissues and sutures, instrument exchange and for overall help during this procedure.    Anesthesia  general EBL: 500 ml Blood Administration: None Xclamp Time: 56 min Pump Time: 113 min  Drains: 19 F blake drain: L, mediastinal  Wires: None Counts: correct   Indications: 71 year old male with three-vessel coronary artery disease.  His echocardiogram shows preserved biventricular function and no significant valvular disease.  On review of his left heart catheterization, he has tandem lesions in the circumflex and required 2 grafts on that we will, his LAD is diffusely diseased, but there is a good distal target, and his PDA also appears graftable.  He has had skin grafting and rods placed in his right thigh, thus harvesting would need the left side.  The risks and benefits four-vessel CABG were discussed, and he is agreeable to proceed  Findings: Good LIMA, very small vein graft.  Heavily calcified LAD.  Small PDA, small OM1, good size OM3  Operative Technique: All invasive lines were placed in pre-op holding.  After the risks, benefits and alternatives were thoroughly discussed, the patient was brought to the operative theatre.   Anesthesia was induced, and the patient was prepped and draped in normal sterile fashion.  An appropriate surgical pause was performed, and pre-operative antibiotics were dosed accordingly.  We began with simultaneous incisions along the left leg for harvesting of the greater saphenous vein and the chest for the sternotomy.  In regards to the sternotomy, this was carried down with bovie cautery, and the sternum was divided with a reciprocating saw.  Meticulous hemostasis was obtained.  The left internal thoracic artery was exposed and harvested in in pedicled fashion.  The patient was systemically heparinized, and the artery was divided distally, and placed in a papaverine sponge.    The sternal elevator was removed, and a retractor was placed.  The pericardium was divided in the midline and fashioned into a cradle with pericardial stitches.   After we confirmed an appropriate ACT, the ascending aorta was cannulated in standard fashion.  The right atrial appendage was used for venous cannulation site.  Cardiopulmonary bypass was initiated, and the heart retractor was placed. The cross clamp was applied, and a dose of anterograde cardioplegia was given with good arrest of the heart.  We moved to the posterior wall of the heart, and found a good target on the PDA.  An arteriotomy was made, and the vein graft was  anastomosed to it in an end to side fashion.  Next we exposed the lateral wall, and found a good target on the OM 3.  An end to side anastomosis with the vein graft was then created.  Next, we exposed another target on the lateral wall of the heart and identified a good target on OM1.   An arteriotomy was created.  The vein was anastomosed in an end to side fashion.  Finally, we exposed a good target on the LAD, and fashioned an end to side anastomosis between it and the LITA.  We began to re-warm, and a re-animation dose of cardioplegia was given.  The heart was de-aired, and the cross clamp was removed.   Meticulous hemostasis was obtained.    A partial occludding clamp was then placed on the ascending aorta, and we created an end to side anastomosis between it and the proximal vein grafts.  Rings were placed on the proximal anastomosis.  Hemostasis was obtained, and we separated from cardiopulmonary bypass without event.  The heparin was reversed with protamine.  Chest tubes and wires were placed, and the sternum was re-approximated with sternal wires.  The soft tissue and skin were re-approximated wth absorbable suture.    The patient tolerated the procedure without any immediate complications, and was transferred to the ICU in guarded condition.  Debbi Strandberg Keane Scrape

## 2022-06-03 NOTE — Discharge Summary (Signed)
Physician Discharge Summary  Patient ID: Dude Tona MRN: [id number] DOB/AGE: 1951/08/02 71 y.o.  Admit date: 06/03/2022 Discharge date: 06/08/2022  Admission Diagnoses:  Multivessel coronary artery disease Angina pectoris Type 2 diabetes mellitus Dyslipidemia Chronic kidney disease  Discharge Diagnoses:   Multivessel coronary artery disease Angina pectoris Type 2 diabetes mellitus Dyslipidemia S/P CABG x 4 Expected acute blood loss anemia Post-op atrial fibrillation Chronic kidney disease  Discharged Condition: stable  History of Present Illness:     71 year old male presents for surgical evaluation three-vessel coronary artery disease.  Over the last year he has had worsening exertional shortness of breath and dyspnea.  He was evaluated by cardiology and underwent a left heart cath which showed multivessel coronary artery disease.  He denies any palpitations, orthopnea, or lower extremity swelling.  He has had a rod placed in his right thigh along with fusion of his right ankle. His echocardiogram shows preserved biventricular function and no significant valvular disease.  On review of his left heart catheterization, he has tandem lesions in the circumflex and required 2 grafts on that we will, his LAD is diffusely diseased, but there is a good distal target, and his PDA also appears graftable.  The risks and benefits of four-vessel CABG were discussed, and he is agreeable to proceed.  Hospital Course: Mr. Fitzmorris was admitted for elective surgery on 06/03/2022.  He was taken to the operating room where four-vessel coronary bypass grafting was carried out.  The left internal mammary artery was grafted to the left anterior descending coronary artery.  Saphenous veins were placed to the first and third obtuse marginal coronary arteries and to the posterior descending coronary artery.  Following the procedure, he separated from cardiopulmonary bypass without difficulty.  He  was transferred to surgical ICU in stable condition. Vital signs and hemodynamics remained stable.  He was weaned from the ventilator and extubated routinely during the early evening after surgery.  He had a brief episode of SVT in the early morning hours of postop day 1.  This was treated with IV metoprolol with prompt resolution and no further recurrence.  Neo-Synephrine was utilized early postoperatively to support blood pressure but this was weaned off on the first postoperative day.  Glucose was closely monitored and hyperglycemia was managed with an insulin drip early postoperatively.  He was transitioned to sliding scale insulin and Levemir on postop day 1.  He had a slight rise in his creatinine to 2.0 early postoperatively that was not unexpected given his history of early stage CKD.  By the second postoperative day, the creatinine was trending back down.  The monitoring lines were removed.  He was mobilized and progressed well.  By the second postoperative day, the chest tube drainage had subsided and so the chest tubes were removed along with the Cordis introducer.  He developed atrial fibrillation with RVR on post-op day 2. BP was in the 130's. He was loaded with amiodarone IV. Transfer to 4E was held until the heart rate was controlled.    Consults: pulmonary/intensive care  Significant Diagnostic Studies:   CLINICAL DATA:  Chest tube   EXAM: PORTABLE CHEST 1 VIEW   COMPARISON:  Chest x-ray dated June 04, 2022   FINDINGS: Stable cardiac and mediastinal contours post median sternotomy. Stable position of right IJ line and chest tube. Mild left-greater-than-right basilar opacities, likely atelectasis. No large pleural effusion or pneumothorax.   IMPRESSION: Stable support devices.     Electronically Signed   By: Yetta Glassman  M.D.   On: 06/05/2022 08:10  Treatments: surgery  06/03/2022 Patient:  [name] Pre-Op Dx: Three-vessel coronary artery disease                          Hypertension                         Hyperlipidemia                         Diabetes mellitus Post-op Dx: Same Procedure: CABG X 4.  LIMA to LAD, reverse saphenous vein graft to PDA, OM 3, OM1 (jump off the hood of the OM 3 vein graft) Endoscopic greater saphenous vein harvest on the left     Surgeon and Role:      * Lightfoot, Eliezer Lofts, MD - Primary    *M.  Jaxden Blyden, PA-C- assisting An experienced assistant was required given the complexity of this surgery and the standard of surgical care. The assistant was needed for exposure, dissection, suctioning, retraction of delicate tissues and sutures, instrument exchange and for overall help during this procedure.     Anesthesia  general EBL: 500 ml Blood Administration: None Xclamp Time: 56 min Pump Time: 113 min   Drains: 19 F blake drain: L, mediastinal  Wires: None Counts: correct     Indications: 71 year old male with three-vessel coronary artery disease.  His echocardiogram shows preserved biventricular function and no significant valvular disease.  On review of his left heart catheterization, he has tandem lesions in the circumflex and required 2 grafts on that we will, his LAD is diffusely diseased, but there is a good distal target, and his PDA also appears graftable.  He has had skin grafting and rods placed in his right thigh, thus harvesting would need the left side.  The risks and benefits four-vessel CABG were discussed, and he is agreeable to proceed   Findings: Good LIMA, very small vein graft.  Heavily calcified LAD.  Small PDA, small OM1, good size OM3  Discharge Exam: Blood pressure 134/78, pulse 71, temperature 98.1 F (36.7 C), temperature source Oral, resp. rate 18, height 5\' 9"  (1.753 m), weight 79.3 kg, SpO2 95 %.  General appearance: alert, cooperative, and no distress Neurologic: intact Heart: Stable SR, no further AF Lungs: breath sounds clear anterior.    Abdomen: soft, NT Extremities:  no peripheral edema, LLE EVH incision is intact and dry Wound: the sternotomy incision and CT sites are intact and dry  Disposition: Discharged to home in stable condition.   Allergies as of 06/08/2022   No Known Allergies      Medication List     STOP taking these medications    diclofenac 50 MG EC tablet Commonly known as: VOLTAREN   HYDROcodone-acetaminophen 7.5-325 MG tablet Commonly known as: NORCO   isosorbide mononitrate 30 MG 24 hr tablet Commonly known as: IMDUR   simvastatin 10 MG tablet Commonly known as: ZOCOR       TAKE these medications    acetaminophen 325 MG tablet Commonly known as: TYLENOL Take 2 tablets (650 mg total) by mouth every 4 (four) hours as needed for mild pain or moderate pain.   amiodarone 200 MG tablet Commonly known as: PACERONE Take 2 tablets (400 mg total) by mouth 2 (two) times daily. For 5 days then decrease the dose to 1 tablet or 200mg  TWICE daily for 14 days then decrease the  dose to 1 tablet or 200mg  ONCE daily.   amLODipine 5 MG tablet Commonly known as: NORVASC Take 1 tablet (5 mg total) by mouth daily.   aspirin 81 MG chewable tablet Chew 81 mg by mouth daily.   atorvastatin 80 MG tablet Commonly known as: LIPITOR Take 1 tablet (80 mg total) by mouth daily.   Coenzyme Q10 200 MG capsule Take 200 mg by mouth daily.   ferrous fumarate-b12-vitamic C-folic acid capsule Commonly known as: TRINSICON / FOLTRIN Take 1 capsule by mouth 2 (two) times daily after a meal. For 1 month then stop   FISH OIL PO Take 1,500 mg by mouth daily.   gabapentin 300 MG capsule Commonly known as: NEURONTIN Take 300 mg by mouth 3 (three) times daily.   glimepiride 4 MG tablet Commonly known as: AMARYL Take 4 mg by mouth daily.   metFORMIN 750 MG 24 hr tablet Commonly known as: GLUCOPHAGE-XR Take 750 mg by mouth 2 (two) times daily.   metoprolol succinate 50 MG 24 hr tablet Commonly known as: TOPROL-XL Take 50 mg by mouth  daily.   nitroGLYCERIN 0.4 MG SL tablet Commonly known as: NITROSTAT Place 0.4 mg under the tongue every 5 (five) minutes as needed for chest pain.   Ozempic (1 MG/DOSE) 4 MG/3ML Sopn Generic drug: Semaglutide (1 MG/DOSE) Inject 1 mg into the skin every Thursday.   traMADol 50 MG tablet Commonly known as: ULTRAM Take 1 tablet (50 mg total) by mouth every 6 (six) hours as needed for up to 7 days for moderate pain. What changed: when to take this        Follow-up Information     Friday, MD. Go on 06/19/2022.   Specialty: Cardiothoracic Surgery Why: Your appointment is at  3:20pm This will be a virtual appointment.  Do not go to the office.  Dr. 06/21/2022 will call you. Contact information: [address] [place] [phone]         [phone], NP. Go on 06/24/2022.   Specialty: Cardiology Why: Your appointment is at 10:55am. Contact information: [address] [place] [phone]                The patient has been discharged on:   1.Beta Blocker:  Yes [  x ]                              No   [   ]                              If No, reason:  2.Ace Inhibitor/ARB: Yes [   ]                                     No  [ x   ]                                     If No, reason:  BP to soft.  3.Statin:   Yes [ x  ]                  No  [   ]  If No, reason:  4.Ecasa:  Yes  [  x ]                  No   [   ]                  If No, reason:   Signed: Antony Odea, PA-C 06/08/2022, 7:58 AM

## 2022-06-03 NOTE — Progress Notes (Signed)
CBG in short stay 110; patient excluded from peri-op glycemic protocol. Will continue to monitor.

## 2022-06-03 NOTE — Procedures (Signed)
Extubation Procedure Note  Patient Details:   Name: Dustin Goodwin DOB: 12/31/1950 MRN: [id number]   Airway Documentation:    Vent end date: 06/03/22 Vent end time: 1740   Evaluation  O2 sats: stable throughout Complications: No apparent complications Patient did tolerate procedure well. Bilateral Breath Sounds: Clear   Yes  Pt extubated per weaning protocol. Placed on 4L. Positive cuff leak, no stridor heard. on VC, -21 on NIF. PT performed with great effort. RT will continue to monitor.   Memory Argue 06/03/2022, 5:50 PM

## 2022-06-03 NOTE — Brief Op Note (Signed)
06/03/2022  12:02 PM  PATIENT:  Dustin Goodwin  71 y.o. male  PRE-OPERATIVE DIAGNOSIS:  Coronary Artery Disease  POST-OPERATIVE DIAGNOSIS:  Coronary Artery Disease  PROCEDURE:  CORONARY ARTERY BYPASS GRAFTING (CABG) TIMES 4, USING LEFT INTERNAL MAMMARY ARTERY AND GREATER SAPHENOUS VEIN   LIMA-LAD SVG-OM1 SVG-OM3 SVG-PDA  TRANSESOPHAGEAL ECHOCARDIOGRAM (TEE) (N/A) Vein harvest time: Vein prep time:  SURGEON:  Corliss Skains, MD - Primary  PHYSICIAN ASSISTANT: Ludivina Guymon  ASSISTANTSTanda Rockers, RN, Scrub Person                     Waldron Labs, RN, RN First Assistant   ANESTHESIA:   general  EBL:   BLOOD ADMINISTERED:none  DRAINS:  Left pleural and mediastinal tubes    LOCAL MEDICATIONS USED:  NONE  SPECIMEN:  No Specimen  DISPOSITION OF SPECIMEN:  N/A  COUNTS:  YES  DICTATION: .Dragon Dictation  PLAN OF CARE: Admit to inpatient   PATIENT DISPOSITION:  ICU - intubated and hemodynamically stable.   Delay start of Pharmacological VTE agent (>24hrs) due to surgical blood loss or risk of bleeding: yes

## 2022-06-03 NOTE — Anesthesia Postprocedure Evaluation (Signed)
Anesthesia Post Note  Patient: Dustin Goodwin  Procedure(s) Performed: CORONARY ARTERY BYPASS GRAFTING (CABG) TIMES 4, USING LEFT INTERNAL MAMMARY ARTERY AND GREATER SAPHENOUS VEIN (Chest) TRANSESOPHAGEAL ECHOCARDIOGRAM (TEE)     Patient location during evaluation: SICU Anesthesia Type: General Level of consciousness: sedated Pain management: pain level controlled Vital Signs Assessment: post-procedure vital signs reviewed and stable Respiratory status: patient remains intubated per anesthesia plan Cardiovascular status: stable Postop Assessment: no apparent nausea or vomiting Anesthetic complications: no   No notable events documented.  Last Vitals:  Vitals:   06/03/22 1545 06/03/22 1600  BP:  125/86  Pulse: 75 73  Resp: 16 13  Temp:    SpO2: 100% 100%    Last Pain:  Vitals:   06/03/22 0639  TempSrc:   PainSc: 0-No pain                 Rishabh Rinkenberger,W. EDMOND

## 2022-06-03 NOTE — Consult Note (Signed)
NAME:  Dustin Goodwin, MRN:  [id number], DOB:  November 29, 1950, LOS: 0 ADMISSION DATE:  06/03/2022, CONSULTATION DATE:  06/03/22 REFERRING MD:  Cliffton Asters, CHIEF COMPLAINT:  s/p CABG  History of Present Illness:  71 year old male presents for surgical evaluation three-vessel coronary artery disease.  Over the last year he has had worsening exertional shortness of breath and dyspnea.  He was evaluated by cardiology and underwent a left heart cath which showed his anatomy.  He denies any palpitations, orthopnea, or lower extremity swelling.  He has had a rod placed in his right thigh along with fusion of his right ankle.  Pertinent  Medical History  HTN, HLD, CAD, T2DM  Significant Hospital Events: Including procedures, antibiotic start and stop dates in addition to other pertinent events   7/12: s/p CABG x 4, intubated  Interim History / Subjective:  S/p uncomplicated CABG x4. Remains intubated on 50% FiO2, 10/5 PEEP. Requiring 4 mcg Phenylephrine.   Objective   Blood pressure 103/65, pulse 63, temperature 97.9 F (36.6 C), temperature source Oral, resp. rate 13, height 5\' 9"  (1.753 m), weight 83 kg, SpO2 95 %.    Vent Mode: SIMV;PRVC;PSV FiO2 (%):  [50 %] 50 % Set Rate:  [12 bmp] 12 bmp Vt Set:  [560 mL] 560 mL PEEP:  [5 cmH20] 5 cmH20 Pressure Support:  [10 cmH20] 10 cmH20   Intake/Output Summary (Last 24 hours) at 06/03/2022 1545 Last data filed at 06/03/2022 1405 Gross per 24 hour  Intake 2810 ml  Output 1503 ml  Net 1307 ml   Filed Weights   06/03/22 0624  Weight: 83 kg    Examination: General: elderly-appearing male laying comfortably in bed HENT: ETT, MM moist Lungs: mechanical breath sounds Cardiovascular: normal rate and rhythm Abdomen: soft, non-distended Extremities: warm, bandages dry over sternum, left lower leg Neuro: Sedated, RASS -3 GU: foley in place  Resolved Hospital Problem list     Assessment & Plan:   S/p CABG x4 - Wean FiO2/PEEP for  SpO2>92% - Wean sedation and trial SBT - Pressor support for MAP >70 - Metoprolol 12.5 mg BID for rate and SBP control  - prn IV metoprolol for SBP >150 - Pain control with tylenol, prn tramadol, prn morphine, prn oxycodone - Continue cefazolin x 2 days (7/12-7/13)  CAD, HLD - Continue ASA 325 mg daily - Continue atorvastatin 80 mg daily  T2DM: A1C 6.6 - insulin gtt   Best Practice (right click and "Reselect all SmartList Selections" daily)   Diet/type: NPO w/ meds via tube DVT prophylaxis: SCD GI prophylaxis: PPI Lines: Central line Foley:  Yes, and it is still needed Code Status:  full code Last date of multidisciplinary goals of care discussion [7/12]  Labs   CBC: Recent Labs  Lab 05/29/22 1107 06/03/22 0913 06/03/22 1150 06/03/22 1215 06/03/22 1238 06/03/22 1302 06/03/22 1356 06/03/22 1421  WBC 5.7  --   --   --   --   --  6.7  --   HGB 14.0   < > 9.2* 8.8* 8.8* 8.8* 10.0* 8.5*  HCT 41.8   < > 25.6* 26.0* 26.0* 26.0* 28.8* 25.0*  MCV 97.2  --   --   --   --   --  96.3  --   PLT 218  --  161  --   --   --  141*  --    < > = values in this interval not displayed.    Basic Metabolic Panel: Recent Labs  Lab 05/29/22 1107 06/03/22 0913 06/03/22 1035 06/03/22 1108 06/03/22 1130 06/03/22 1215 06/03/22 1238 06/03/22 1302 06/03/22 1421  NA 138 139 139   < > 138 138 138 138 140  K 4.2 4.0 4.2   < > 4.6 5.2* 4.9 4.7 4.3  CL 104 103 103  --  102 103  --  104  --   CO2 23  --   --   --   --   --   --   --   --   GLUCOSE 228* 99 85  --  100* 159*  --  137*  --   BUN 30* 26* 27*  --  26* 27*  --  29*  --   CREATININE 1.74* 1.50* 1.40*  --  1.30* 1.30*  --  1.30*  --   CALCIUM 9.2  --   --   --   --   --   --   --   --    < > = values in this interval not displayed.   GFR: Estimated Creatinine Clearance: 52.1 mL/min (A) (by C-G formula based on SCr of 1.3 mg/dL (H)). Recent Labs  Lab 05/29/22 1107 06/03/22 1356  WBC 5.7 6.7    Liver Function  Tests: Recent Labs  Lab 05/29/22 1107  AST 19  ALT 18  ALKPHOS 60  BILITOT 1.0  PROT 6.8  ALBUMIN 4.0    ABG    Component Value Date/Time   PHART 7.383 06/03/2022 1421   PCO2ART 36.4 06/03/2022 1421   PO2ART 103 06/03/2022 1421   HCO3 21.9 06/03/2022 1421   TCO2 23 06/03/2022 1421   ACIDBASEDEF 3.0 (H) 06/03/2022 1421   O2SAT 98 06/03/2022 1421     Coagulation Profile: Recent Labs  Lab 05/29/22 1107 06/03/22 1356  INR 1.1 1.4*     HbA1C: Hgb A1c MFr Bld  Date/Time Value Ref Range Status  05/29/2022 11:08 AM 6.6 (H) 4.8 - 5.6 % Final    Comment:    (NOTE) Pre diabetes:          5.7%-6.4%  Diabetes:              >6.4%  Glycemic control for   <7.0% adults with diabetes     CBG: Recent Labs  Lab 05/29/22 1046 06/03/22 0623 06/03/22 1419  GLUCAP 239* 110* 128*      Past Medical History:  He,  has a past medical history of Arthritis, Coronary artery disease, Diabetes mellitus without complication (HCC), Hyperlipidemia, Hypertension, and Neuropathy.   Surgical History:   Past Surgical History:  Procedure Laterality Date   ANKLE SURGERY Right 1978   fell from lift-leg and ankle surgery   BACK SURGERY  2017   FOOT SURGERY Right 1963   HERNIA REPAIR     umbilical and inguinal   LEFT HEART CATH AND CORONARY ANGIOGRAPHY N/A 05/06/2022   Procedure: LEFT HEART CATH AND CORONARY ANGIOGRAPHY;  Surgeon: [name], MD;  Location: [facility];  Service: Cardiovascular;  Laterality: N/A;   LEG SURGERY Right 1978   fell from a lift-skin graft needed     Social History:   reports that he has never smoked. He has never used smokeless tobacco. He reports that he does not drink alcohol and does not use drugs.   Family History:  His family history is not on file.   Allergies No Known Allergies   Home Medications  Prior to Admission medications   Medication Sig  Start Date End Date Taking? Authorizing Provider  amLODipine (NORVASC) 5 MG  tablet Take 1 tablet (5 mg total) by mouth daily. 03/26/22 09/22/22 Yes Troy Sine, MD  aspirin 81 MG chewable tablet Chew 81 mg by mouth daily. 08/27/21  Yes [provider]  Coenzyme Q10 200 MG capsule Take 200 mg by mouth daily.   Yes [provider]  diclofenac (VOLTAREN) 50 MG EC tablet Take 50 mg by mouth 2 (two) times daily. 03/11/22  Yes [provider]  gabapentin (NEURONTIN) 300 MG capsule Take 300 mg by mouth 3 (three) times daily. 01/19/22  Yes [provider]  glimepiride (AMARYL) 4 MG tablet Take 4 mg by mouth daily. 05/02/21  Yes [provider]  HYDROcodone-acetaminophen (NORCO) 7.5-325 MG tablet Take 1 tablet by mouth every 6 (six) hours as needed for moderate pain. 02/10/22  Yes [provider]  isosorbide mononitrate (IMDUR) 30 MG 24 hr tablet Take 1 tablet (30 mg total) by mouth daily. 04/27/22  Yes Troy Sine, MD  metFORMIN (GLUCOPHAGE-XR) 750 MG 24 hr tablet Take 750 mg by mouth 2 (two) times daily. 11/14/21  Yes [provider]  metoprolol succinate (TOPROL-XL) 50 MG 24 hr tablet Take 50 mg by mouth daily. 03/20/22  Yes [provider]  Omega-3 Fatty Acids (FISH OIL PO) Take 1,500 mg by mouth daily.   Yes [provider]  OZEMPIC, 1 MG/DOSE, 4 MG/3ML SOPN Inject 1 mg into the skin every Thursday. 01/15/22  Yes [provider]  simvastatin (ZOCOR) 10 MG tablet Take 10 mg by mouth at bedtime. 02/03/22  Yes [provider]  nitroGLYCERIN (NITROSTAT) 0.4 MG SL tablet Place 0.4 mg under the tongue every 5 (five) minutes as needed for chest pain. 02/17/22   [provider]  traMADol (ULTRAM) 50 MG tablet Take 50 mg by mouth 4 (four) times daily as needed for moderate pain. 03/24/22   [provider]     Critical care time:    Mallie Snooks, MS4, Randye Lobo

## 2022-06-03 NOTE — Anesthesia Procedure Notes (Signed)
Central Venous Catheter Insertion Performed by: Gaynelle Adu, MD, anesthesiologist Start/End7/10/2022 7:40 AM, 06/03/2022 7:55 AM Patient location: Pre-op. Preanesthetic checklist: patient identified, IV checked, site marked, risks and benefits discussed, surgical consent, monitors and equipment checked, pre-op evaluation, timeout performed and anesthesia consent Position: Trendelenburg Lidocaine 1% used for infiltration and patient sedated Hand hygiene performed , maximum sterile barriers used  and Seldinger technique used Catheter size: 8.5 Fr Total catheter length 10. Central line was placed.Sheath introducer Procedure performed using ultrasound guided technique. Ultrasound Notes:anatomy identified, needle tip was noted to be adjacent to the nerve/plexus identified, no ultrasound evidence of intravascular and/or intraneural injection and image(s) printed for medical record Attempts: 1 Following insertion, line sutured, dressing applied and Biopatch. Post procedure assessment: blood return through all ports, free fluid flow and no air  Patient tolerated the procedure well with no immediate complications. Additional procedure comments: Triple lumen slick catheter placed through the [facility] port.Marland Kitchen

## 2022-06-03 NOTE — Transfer of Care (Signed)
Immediate Anesthesia Transfer of Care Note  Patient: Dustin Goodwin  Procedure(s) Performed: CORONARY ARTERY BYPASS GRAFTING (CABG) TIMES 4, USING LEFT INTERNAL MAMMARY ARTERY AND GREATER SAPHENOUS VEIN (Chest) TRANSESOPHAGEAL ECHOCARDIOGRAM (TEE)  Patient Location: SICU  Anesthesia Type:General  Level of Consciousness: sedated and Patient remains intubated per anesthesia plan  Airway & Oxygen Therapy: Patient remains intubated per anesthesia plan and Patient placed on Ventilator (see vital sign flow sheet for setting)  Post-op Assessment: Report given to RN and Post -op Vital signs reviewed and stable  Post vital signs: Reviewed and stable  Last Vitals:  Vitals Value Taken Time  BP    Temp    Pulse 76 06/03/22 1405  Resp 12 06/03/22 1405  SpO2 95 % 06/03/22 1405  Vitals shown include unvalidated device data.  Last Pain:  Vitals:   06/03/22 0639  TempSrc:   PainSc: 0-No pain         Complications: No notable events documented.

## 2022-06-03 NOTE — Interval H&P Note (Signed)
History and Physical Interval Note:  06/03/2022 8:35 AM  Dustin Goodwin  has presented today for surgery, with the diagnosis of Coronary Artery Disease.  The various methods of treatment have been discussed with the patient and family. After consideration of risks, benefits and other options for treatment, the patient has consented to  Procedure(s): CORONARY ARTERY BYPASS GRAFTING (CABG) TIMES , USING LEFT INTERNAL MAMMARY ARTERY AND GREATER SAPHENOUS VEIN (N/A) TRANSESOPHAGEAL ECHOCARDIOGRAM (TEE) (N/A) as a surgical intervention.  The patient's history has been reviewed, patient examined, no change in status, stable for surgery.  I have reviewed the patient's chart and labs.  Questions were answered to the patient's satisfaction.     Sahmya Arai Keane Scrape

## 2022-06-03 NOTE — Discharge Instructions (Signed)

## 2022-06-04 ENCOUNTER — Encounter (HOSPITAL_COMMUNITY): Payer: Self-pay | Admitting: Thoracic Surgery (Cardiothoracic Vascular Surgery)

## 2022-06-04 ENCOUNTER — Inpatient Hospital Stay (HOSPITAL_COMMUNITY): Payer: Medicare Other

## 2022-06-04 DIAGNOSIS — I25119 Atherosclerotic heart disease of native coronary artery with unspecified angina pectoris: Secondary | ICD-10-CM | POA: Diagnosis not present

## 2022-06-04 LAB — CBC
HCT: 30.5 % — ABNORMAL LOW (ref 39.0–52.0)
HCT: 33.2 % — ABNORMAL LOW (ref 39.0–52.0)
Hemoglobin: 10.3 g/dL — ABNORMAL LOW (ref 13.0–17.0)
Hemoglobin: 11.5 g/dL — ABNORMAL LOW (ref 13.0–17.0)
MCH: 33.3 pg (ref 26.0–34.0)
MCH: 33.7 pg (ref 26.0–34.0)
MCHC: 33.8 g/dL (ref 30.0–36.0)
MCHC: 34.6 g/dL (ref 30.0–36.0)
MCV: 98.7 fL (ref 80.0–100.0)
MCV: 97.4 fL (ref 80.0–100.0)
Platelets: 178 10*3/uL (ref 150–400)
Platelets: 217 10*3/uL (ref 150–400)
RBC: 3.09 MIL/uL — ABNORMAL LOW (ref 4.22–5.81)
RBC: 3.41 MIL/uL — ABNORMAL LOW (ref 4.22–5.81)
RDW: 13.1 % (ref 11.5–15.5)
RDW: 13.2 % (ref 11.5–15.5)
WBC: 9.3 10*3/uL (ref 4.0–10.5)
WBC: 13.6 10*3/uL — ABNORMAL HIGH (ref 4.0–10.5)
nRBC: 0 % (ref 0.0–0.2)
nRBC: 0 % (ref 0.0–0.2)

## 2022-06-04 LAB — POCT I-STAT 7, (LYTES, BLD GAS, ICA,H+H)
Acid-base deficit: 4 mmol/L — ABNORMAL HIGH (ref 0.0–2.0)
Acid-base deficit: 7 mmol/L — ABNORMAL HIGH (ref 0.0–2.0)
Bicarbonate: 21.1 mmol/L (ref 20.0–28.0)
Bicarbonate: 18.9 mmol/L — ABNORMAL LOW (ref 20.0–28.0)
Calcium, Ion: 1.21 mmol/L (ref 1.15–1.40)
Calcium, Ion: 1.21 mmol/L (ref 1.15–1.40)
HCT: 28 % — ABNORMAL LOW (ref 39.0–52.0)
HCT: 28 % — ABNORMAL LOW (ref 39.0–52.0)
Hemoglobin: 9.5 g/dL — ABNORMAL LOW (ref 13.0–17.0)
Hemoglobin: 9.5 g/dL — ABNORMAL LOW (ref 13.0–17.0)
O2 Saturation: 97 %
O2 Saturation: 93 %
Patient temperature: 36.7
Patient temperature: 37.1
Potassium: 4.3 mmol/L (ref 3.5–5.1)
Potassium: 4.1 mmol/L (ref 3.5–5.1)
Sodium: 142 mmol/L (ref 135–145)
Sodium: 142 mmol/L (ref 135–145)
TCO2: 22 mmol/L (ref 22–32)
TCO2: 20 mmol/L — ABNORMAL LOW (ref 22–32)
pCO2 arterial: 35.7 mmHg (ref 32–48)
pCO2 arterial: 37.9 mmHg (ref 32–48)
pH, Arterial: 7.377 (ref 7.35–7.45)
pH, Arterial: 7.306 — ABNORMAL LOW (ref 7.35–7.45)
pO2, Arterial: 96 mmHg (ref 83–108)
pO2, Arterial: 74 mmHg — ABNORMAL LOW (ref 83–108)

## 2022-06-04 LAB — GLUCOSE, CAPILLARY
Glucose-Capillary: 111 mg/dL — ABNORMAL HIGH (ref 70–99)
Glucose-Capillary: 117 mg/dL — ABNORMAL HIGH (ref 70–99)
Glucose-Capillary: 119 mg/dL — ABNORMAL HIGH (ref 70–99)
Glucose-Capillary: 119 mg/dL — ABNORMAL HIGH (ref 70–99)
Glucose-Capillary: 129 mg/dL — ABNORMAL HIGH (ref 70–99)
Glucose-Capillary: 132 mg/dL — ABNORMAL HIGH (ref 70–99)
Glucose-Capillary: 132 mg/dL — ABNORMAL HIGH (ref 70–99)
Glucose-Capillary: 140 mg/dL — ABNORMAL HIGH (ref 70–99)
Glucose-Capillary: 159 mg/dL — ABNORMAL HIGH (ref 70–99)
Glucose-Capillary: 174 mg/dL — ABNORMAL HIGH (ref 70–99)
Glucose-Capillary: 176 mg/dL — ABNORMAL HIGH (ref 70–99)

## 2022-06-04 LAB — BASIC METABOLIC PANEL
Anion gap: 11 (ref 5–15)
Anion gap: 10 (ref 5–15)
BUN: 22 mg/dL (ref 8–23)
BUN: 24 mg/dL — ABNORMAL HIGH (ref 8–23)
CO2: 19 mmol/L — ABNORMAL LOW (ref 22–32)
CO2: 23 mmol/L (ref 22–32)
Calcium: 8.5 mg/dL — ABNORMAL LOW (ref 8.9–10.3)
Calcium: 8.7 mg/dL — ABNORMAL LOW (ref 8.9–10.3)
Chloride: 108 mmol/L (ref 98–111)
Chloride: 103 mmol/L (ref 98–111)
Creatinine, Ser: 1.53 mg/dL — ABNORMAL HIGH (ref 0.61–1.24)
Creatinine, Ser: 2 mg/dL — ABNORMAL HIGH (ref 0.61–1.24)
GFR, Estimated: 48 mL/min — ABNORMAL LOW (ref >60)
GFR, Estimated: 35 mL/min — ABNORMAL LOW (ref >60)
Glucose, Bld: 119 mg/dL — ABNORMAL HIGH (ref 70–99)
Glucose, Bld: 177 mg/dL — ABNORMAL HIGH (ref 70–99)
Potassium: 5.1 mmol/L (ref 3.5–5.1)
Potassium: 5 mmol/L (ref 3.5–5.1)
Sodium: 138 mmol/L (ref 135–145)
Sodium: 136 mmol/L (ref 135–145)

## 2022-06-04 LAB — MAGNESIUM
Magnesium: 3 mg/dL — ABNORMAL HIGH (ref 1.7–2.4)
Magnesium: 2.8 mg/dL — ABNORMAL HIGH (ref 1.7–2.4)

## 2022-06-04 MED ORDER — ENOXAPARIN SODIUM 30 MG/0.3ML IJ SOSY
30.0000 mg | PREFILLED_SYRINGE | Freq: Every day | INTRAMUSCULAR | Status: DC
  Administered 2022-06-04 – 2022-06-07 (×4): 30 mg via SUBCUTANEOUS
  Filled 2022-06-04 (×4): qty 0.3

## 2022-06-04 MED ORDER — INSULIN DETEMIR 100 UNIT/ML ~~LOC~~ SOLN
20.0000 [IU] | Freq: Every day | SUBCUTANEOUS | Status: DC
  Administered 2022-06-04 – 2022-06-06 (×3): 20 [IU] via SUBCUTANEOUS
  Filled 2022-06-04 (×4): qty 0.2

## 2022-06-04 MED ORDER — ORAL CARE MOUTH RINSE
15.0000 mL | OROMUCOSAL | Status: DC | PRN
Start: 2022-06-04 — End: 2022-06-08

## 2022-06-04 MED ORDER — FUROSEMIDE 10 MG/ML IJ SOLN
40.0000 mg | Freq: Once | INTRAMUSCULAR | Status: AC
  Administered 2022-06-04: 40 mg via INTRAVENOUS
  Filled 2022-06-04: qty 4

## 2022-06-04 MED ORDER — ENOXAPARIN SODIUM 40 MG/0.4ML IJ SOSY: 40.0000 mg | PREFILLED_SYRINGE | Freq: Every day | INTRAMUSCULAR | Status: DC

## 2022-06-04 MED ORDER — METOPROLOL TARTRATE 25 MG PO TABS
25.0000 mg | ORAL_TABLET | Freq: Two times a day (BID) | ORAL | Status: DC
  Administered 2022-06-04 – 2022-06-08 (×8): 25 mg via ORAL
  Filled 2022-06-04 (×8): qty 1

## 2022-06-04 MED ORDER — INSULIN ASPART 100 UNIT/ML IJ SOLN
0.0000 [IU] | INTRAMUSCULAR | Status: DC
  Administered 2022-06-04 (×3): 2 [IU] via SUBCUTANEOUS
  Administered 2022-06-05: 4 [IU] via SUBCUTANEOUS
  Administered 2022-06-05: 8 [IU] via SUBCUTANEOUS
  Administered 2022-06-05 (×2): 2 [IU] via SUBCUTANEOUS
  Administered 2022-06-06: 4 [IU] via SUBCUTANEOUS
  Administered 2022-06-06: 2 [IU] via SUBCUTANEOUS
  Administered 2022-06-06 – 2022-06-07 (×3): 4 [IU] via SUBCUTANEOUS
  Administered 2022-06-08: 2 [IU] via SUBCUTANEOUS
  Administered 2022-06-08: 4 [IU] via SUBCUTANEOUS

## 2022-06-04 MED ORDER — METHOCARBAMOL 500 MG PO TABS
500.0000 mg | ORAL_TABLET | Freq: Three times a day (TID) | ORAL | Status: AC
  Administered 2022-06-04 – 2022-06-06 (×8): 500 mg via ORAL
  Filled 2022-06-04 (×9): qty 1

## 2022-06-04 MED ORDER — INSULIN DETEMIR 100 UNIT/ML ~~LOC~~ SOLN
15.0000 [IU] | Freq: Every day | SUBCUTANEOUS | Status: DC
  Administered 2022-06-05 – 2022-06-06 (×2): 15 [IU] via SUBCUTANEOUS
  Filled 2022-06-04 (×3): qty 0.15

## 2022-06-04 NOTE — Progress Notes (Signed)
   NAME:  Dustin Goodwin, MRN:  [id number], DOB:  [birth year], LOS: 1 ADMISSION DATE:  06/03/2022, CONSULTATION DATE:  06/03/22 REFERRING MD:  Cliffton Asters, CHIEF COMPLAINT:  s/p CABG  History of Present Illness:  71 year old male presents for surgical evaluation three-vessel coronary artery disease.  Over the last year he has had worsening exertional shortness of breath and dyspnea.  He was evaluated by cardiology and underwent a left heart cath which showed his anatomy.  He denies any palpitations, orthopnea, or lower extremity swelling.  He has had a rod placed in his right thigh along with fusion of his right ankle.  Pertinent  Medical History  HTN, HLD, CAD, T2DM  Significant Hospital Events: Including procedures, antibiotic start and stop dates in addition to other pertinent events   7/12: s/p CABG x 4, extubated  Interim History / Subjective:  S/p CABG x4 POD#1. Extubated yesterday afternoon. Very chatty this morning. Endorses pain with deep breaths. Using 6L [place].  Objective   Blood pressure 132/75, pulse 77, temperature 100.2 F (37.9 C), temperature source Bladder, resp. rate (!) 22, height 5\' 9"  (1.753 m), weight 83 kg, SpO2 93 %. CVP:  [0 mmHg-14 mmHg] 14 mmHg  Vent Mode: PSV;CPAP FiO2 (%):  [40 %-50 %] 40 % Set Rate:  [4 bmp-12 bmp] 4 bmp Vt Set:  [560 mL] 560 mL PEEP:  [5 cmH20] 5 cmH20 Pressure Support:  [10 cmH20] 10 cmH20   Intake/Output Summary (Last 24 hours) at 06/04/2022 0904 Last data filed at 06/04/2022 06/06/2022 Gross per 24 hour  Intake 4705.32 ml  Output 4298 ml  Net 407.32 ml    Filed Weights   06/03/22 0624  Weight: 83 kg    Examination: General: elderly- appearing male laying comfortably in bed, conversant HEENT: MM moist Resp: L bibasilar crackles, pain with deep breaths  CV: normal rate and rhythm Abd: soft, non-distended Skin: bandages over sternum, left lower leg GU: foley in place  Ancillary tests personally reviewed:    Cr 1.53 (BL~1.3) Hgb  10.3 CXR with low lung volumes with streaky left basilar and mid lung opacities, likely atelectasis  Assessment & Plan:   S/p CABG x4 POD 1: Remains hemodynamically stable  - Wean O2 requirement for SpO2>92% - Wean off Phenylphrine  - Metoprolol 12.5 mg BID for rate and SBP control  - prn IV metoprolol for SBP >150 - Pain control with tylenol, prn tramadol, prn morphine, prn oxycodone - Remove arterial line  CAD, HLD - Continue ASA 325 mg daily - Continue atorvastatin 80 mg daily  AKI on CKD: Cr 1.5 (1.3) - ctm  T2DM: A1C 6.6 - Levemir 15u AM, 20u PM - hold home ozempic  Best Practice (right click and "Reselect all SmartList Selections" daily)   Diet/type: NPO w/ meds via tube DVT prophylaxis: SCD, enoxaparin GI prophylaxis: PPI Lines: Central line Foley:  Yes, and it is still needed Code Status:  full code Last date of multidisciplinary goals of care discussion [7/12]  08/04/22, MS4, Aaron Edelman

## 2022-06-04 NOTE — Progress Notes (Addendum)
Patient ID: Jamarius Saha, male   DOB: [birth year], 71 y.o.   MRN: [id number]  TCTS Evening Rounds:  Hemodynamically stable in sinus rhythm. Sats 90% on 6L.  UO ok. Creat increased to 2.0 this evening.   CT output low.  BMET    Component Value Date/Time   NA 136 06/04/2022 1602   NA 143 04/27/2022 1413   K 5.0 06/04/2022 1602   CL 103 06/04/2022 1602   CO2 23 06/04/2022 1602   GLUCOSE 177 (H) 06/04/2022 1602   BUN 24 (H) 06/04/2022 1602   BUN 24 04/27/2022 1413   CREATININE 2.00 (H) 06/04/2022 1602   CALCIUM 8.7 (L) 06/04/2022 1602   EGFR 58 (L) 04/27/2022 1413   GFRNONAA 35 (L) 06/04/2022 1602   CBC    Component Value Date/Time   WBC 13.6 (H) 06/04/2022 1602   RBC 3.41 (L) 06/04/2022 1602   HGB 11.5 (L) 06/04/2022 1602   HGB 15.1 04/27/2022 1413   HCT 33.2 (L) 06/04/2022 1602   HCT 43.5 04/27/2022 1413   PLT 217 06/04/2022 1602   PLT 238 04/27/2022 1413   MCV 97.4 06/04/2022 1602   MCV 95 04/27/2022 1413   MCH 33.7 06/04/2022 1602   MCHC 34.6 06/04/2022 1602   RDW 13.2 06/04/2022 1602   RDW 13.1 04/27/2022 1413

## 2022-06-04 NOTE — Progress Notes (Addendum)
TCTS DAILY ICU PROGRESS NOTE                   [address]            [place] [address]          [phone]   1 Day Post-Op Procedure(s) (LRB): CORONARY ARTERY BYPASS GRAFTING (CABG) TIMES 4, USING LEFT INTERNAL MAMMARY ARTERY AND GREATER SAPHENOUS VEIN (N/A) TRANSESOPHAGEAL ECHOCARDIOGRAM (TEE) (N/A)  Total Length of Stay:  LOS: 1 day   Subjective: Extubated by 6pm yesterday. Awake and alert, pain control reasonable.   Objective: Vital signs in last 24 hours: Temp:  [97.7 F (36.5 C)-99.9 F (37.7 C)] 99.9 F (37.7 C) (07/13 0700) Pulse Rate:  [63-168] 69 (07/13 0700) Resp:  [8-31] 19 (07/13 0700) BP: (88-136)/(49-86) 88/64 (07/13 0640) SpO2:  [90 %-100 %] 95 % (07/13 0700) Arterial Line BP: (98-157)/(46-73) 124/54 (07/13 0700) FiO2 (%):  [40 %-50 %] 40 % (07/12 1806)  Filed Weights   06/03/22 0624  Weight: 83 kg    Weight change:    Hemodynamic parameters for last 24 hours: CVP:  [0 mmHg-11 mmHg] 8 mmHg  Intake/Output from previous day: 07/12 0701 - 07/13 0700 In: 4632.1 [I.V.:3030.5; Blood:260; IV Piggyback:1341.6] Out: 4148 [Urine:3255; Blood:503; Chest Tube:390]  Intake/Output this shift: No intake/output data recorded.  Current Meds: Scheduled Meds:  acetaminophen  1,000 mg Oral Q6H   Or   acetaminophen (TYLENOL) oral liquid 160 mg/5 mL  1,000 mg Per Tube Q6H   aspirin EC  325 mg Oral Daily   Or   aspirin  324 mg Per Tube Daily   atorvastatin  80 mg Oral Daily   bisacodyl  10 mg Oral Daily   Or   bisacodyl  10 mg Rectal Daily   Chlorhexidine Gluconate Cloth  6 each Topical Daily   docusate sodium  200 mg Oral Daily   metoprolol tartrate  12.5 mg Oral BID   Or   metoprolol tartrate  12.5 mg Per Tube BID   [START ON 06/05/2022] pantoprazole  40 mg Oral Daily   sodium chloride flush  10-40 mL Intracatheter Q12H   sodium chloride flush  3 mL Intravenous Q12H   Continuous Infusions:  sodium chloride Stopped (06/04/22 0636)    sodium chloride     sodium chloride     albumin human 60 mL/hr at 06/04/22 0700    ceFAZolin (ANCEF) IV 200 mL/hr at 06/04/22 0700   dexmedetomidine (PRECEDEX) IV infusion Stopped (06/04/22 0636)   insulin 0.7 Units/hr (06/04/22 0700)   lactated ringers     lactated ringers Stopped (06/03/22 1547)   lactated ringers 20 mL/hr at 06/04/22 0700   nitroGLYCERIN Stopped (06/03/22 1807)   norepinephrine (LEVOPHED) Adult infusion Stopped (06/03/22 1400)   phenylephrine (NEO-SYNEPHRINE) Adult infusion 30 mcg/min (06/04/22 0700)   PRN Meds:.sodium chloride, albumin human, dextrose, lactated ringers, metoprolol tartrate, midazolam, morphine injection, ondansetron (ZOFRAN) IV, mouth rinse, oxyCODONE, sodium chloride flush, sodium chloride flush, traMADol  General appearance: alert, cooperative, and no distress Neurologic: intact Heart: SR, had a brief episode of SVT early this morning treated with IV metoprolol. Excellent hemodynamics since surgery.  Lungs: breath sounds clear anterior. CXR with no unexpected changes.  CT drainage past 12 hours.  Abdomen: soft, NT Extremities: no peripheral edema, LLE EVH incision is intact and dry Wound: the sternotomy incision is covered with a dry Aquacel dressing.   Lab Results: CBC: Recent Labs    06/03/22 2033  06/04/22 0406  WBC 10.7* 9.3  HGB 10.5* 10.3*  HCT 30.6* 30.5*  PLT 180 178   BMET:  Recent Labs    06/03/22 2033 06/04/22 0406  NA 138 138  K 4.8 5.1  CL 108 108  CO2 21* 19*  GLUCOSE 137* 119*  BUN 24* 22  CREATININE 1.58* 1.53*  CALCIUM 8.5* 8.5*    CMET: Lab Results  Component Value Date   WBC 9.3 06/04/2022   HGB 10.3 (L) 06/04/2022   HCT 30.5 (L) 06/04/2022   PLT 178 06/04/2022   GLUCOSE 119 (H) 06/04/2022   CHOL 90 (L) 03/27/2022   TRIG 136 03/27/2022   HDL 33 (L) 03/27/2022   LDLCALC 33 03/27/2022   ALT 18 05/29/2022   AST 19 05/29/2022   NA 138 06/04/2022   K 5.1 06/04/2022   CL 108 06/04/2022    CREATININE 1.53 (H) 06/04/2022   BUN 22 06/04/2022   CO2 19 (L) 06/04/2022   TSH 0.648 03/27/2022   INR 1.4 (H) 06/03/2022   HGBA1C 6.6 (H) 05/29/2022      PT/INR:  Recent Labs    06/03/22 1356  LABPROT 17.2*  INR 1.4*   Radiology: [facility] Chest Port 1 View  Result Date: 06/03/2022 CLINICAL DATA:  Status post CABG. EXAM: PORTABLE CHEST 1 VIEW COMPARISON:  Chest radiograph dated 05/29/2022. FINDINGS: Endotracheal tube with tip approximately 4.5 cm above the carina. Enteric tube with side-port in the region of the GE junction and tip in the proximal stomach. Recommend further advancing of the tube by additional 5 cm. Right IJ central venous line with tip over upper SVC. Inferiorly accessed catheter with tip projecting over the left ventricle. Shallow inspiration with bibasilar atelectasis. Left mid lung field linear and platelike atelectasis or scarring. No consolidative changes. There is no pleural effusion pneumothorax. Slight widening of the mediastinum, likely postsurgical. Median sternotomy wires. Degenerative changes of the spine and shoulders. IMPRESSION: 1. Postsurgical changes of CABG as above. 2. Endotracheal tube with tip above the carina. 3. Enteric tube with side-port in the region of the GE junction and tip in the proximal stomach. Recommend further advancing of the tube by additional 5 cm. Electronically Signed   By: Elgie Collard M.D.   On: 06/03/2022 14:41     Assessment/Plan: S/P Procedure(s) (LRB): CORONARY ARTERY BYPASS GRAFTING (CABG) TIMES 4, USING LEFT INTERNAL MAMMARY ARTERY AND GREATER SAPHENOUS VEIN (N/A) TRANSESOPHAGEAL ECHOCARDIOGRAM (TEE) (N/A)  -Postop day 1 CABG x4 with initial presentation of angina pectoris.  Has preserved biventricular function.  Stable hemodynamics since surgery.  Brief episode of SVT earlier this morning resolved with IV metoprolol with no recurrence.  Wean and discontinue the Neo-Synephrine.  DC the arterial line.  Mobilize per  protocol.  -History of type 2 diabetes-good control on insulin drip since surgery.  We will transition to sliding scale insulin and Levemir.  -Chronic kidney disease: Mild increase in creatinine to 1.5 from baseline of 1.3.  Monitor  -Expected acute blood loss anemia mild and tolerating well  -DVT prophylaxis, to begin daily enoxaparin.  Mobilize   Leary Roca, PA-C [phone]  06/04/2022 7:48 AM   Agree wotj above Doing well Increasing BB, will remove a line Will watch creat Continue ICU care  Yulonda Wheeling O Yan Okray

## 2022-06-04 NOTE — Progress Notes (Signed)
EKG CRITICAL VALUE     12 lead EKG performed.  Critical value noted.  Dairl Ponder, RN notified.   Marzella Schlein Kambra Beachem, CCT 06/04/2022 7:22 AM

## 2022-06-05 ENCOUNTER — Other Ambulatory Visit (HOSPITAL_COMMUNITY): Payer: Medicare Other

## 2022-06-05 ENCOUNTER — Inpatient Hospital Stay (HOSPITAL_COMMUNITY): Payer: Medicare Other

## 2022-06-05 LAB — CBC
HCT: 30.7 % — ABNORMAL LOW (ref 39.0–52.0)
Hemoglobin: 10.4 g/dL — ABNORMAL LOW (ref 13.0–17.0)
MCH: 33.1 pg (ref 26.0–34.0)
MCHC: 33.9 g/dL (ref 30.0–36.0)
MCV: 97.8 fL (ref 80.0–100.0)
Platelets: 190 10*3/uL (ref 150–400)
RBC: 3.14 MIL/uL — ABNORMAL LOW (ref 4.22–5.81)
RDW: 13.2 % (ref 11.5–15.5)
WBC: 11 10*3/uL — ABNORMAL HIGH (ref 4.0–10.5)
nRBC: 0 % (ref 0.0–0.2)

## 2022-06-05 LAB — BASIC METABOLIC PANEL
Anion gap: 10 (ref 5–15)
BUN: 28 mg/dL — ABNORMAL HIGH (ref 8–23)
CO2: 24 mmol/L (ref 22–32)
Calcium: 8.4 mg/dL — ABNORMAL LOW (ref 8.9–10.3)
Chloride: 100 mmol/L (ref 98–111)
Creatinine, Ser: 1.94 mg/dL — ABNORMAL HIGH (ref 0.61–1.24)
GFR, Estimated: 36 mL/min — ABNORMAL LOW (ref >60)
Glucose, Bld: 143 mg/dL — ABNORMAL HIGH (ref 70–99)
Potassium: 4.5 mmol/L (ref 3.5–5.1)
Sodium: 134 mmol/L — ABNORMAL LOW (ref 135–145)

## 2022-06-05 LAB — GLUCOSE, CAPILLARY
Glucose-Capillary: 129 mg/dL — ABNORMAL HIGH (ref 70–99)
Glucose-Capillary: 201 mg/dL — ABNORMAL HIGH (ref 70–99)
Glucose-Capillary: 148 mg/dL — ABNORMAL HIGH (ref 70–99)
Glucose-Capillary: 154 mg/dL — ABNORMAL HIGH (ref 70–99)
Glucose-Capillary: 163 mg/dL — ABNORMAL HIGH (ref 70–99)

## 2022-06-05 MED ORDER — ~~LOC~~ CARDIAC SURGERY, PATIENT & FAMILY EDUCATION: Freq: Once | Status: AC

## 2022-06-05 MED ORDER — AMIODARONE LOAD VIA INFUSION
150.0000 mg | Freq: Once | INTRAVENOUS | Status: AC
  Administered 2022-06-05: 150 mg via INTRAVENOUS
  Filled 2022-06-05: qty 83.34

## 2022-06-05 MED ORDER — SODIUM CHLORIDE 0.9% FLUSH
3.0000 mL | Freq: Two times a day (BID) | INTRAVENOUS | Status: DC
  Administered 2022-06-05 – 2022-06-06 (×3): 3 mL via INTRAVENOUS

## 2022-06-05 MED ORDER — AMIODARONE HCL IN DEXTROSE 360-4.14 MG/200ML-% IV SOLN
30.0000 mg/h | INTRAVENOUS | Status: DC
  Administered 2022-06-05: 30 mg/h via INTRAVENOUS
  Filled 2022-06-05 (×2): qty 200

## 2022-06-05 MED ORDER — SODIUM CHLORIDE 0.9% FLUSH
3.0000 mL | INTRAVENOUS | Status: DC | PRN
Start: 2022-06-05 — End: 2022-06-08

## 2022-06-05 MED ORDER — SODIUM CHLORIDE 0.9 % IV SOLN: 250.0000 mL | INTRAVENOUS | Status: DC | PRN

## 2022-06-05 MED ORDER — METOPROLOL TARTRATE 5 MG/5ML IV SOLN
5.0000 mg | Freq: Once | INTRAVENOUS | Status: AC
  Administered 2022-06-05: 5 mg via INTRAVENOUS
  Filled 2022-06-05: qty 5

## 2022-06-05 MED ORDER — AMIODARONE HCL IN DEXTROSE 360-4.14 MG/200ML-% IV SOLN
60.0000 mg/h | INTRAVENOUS | Status: AC
  Administered 2022-06-05 (×2): 60 mg/h via INTRAVENOUS
  Filled 2022-06-05 (×2): qty 200

## 2022-06-05 MED FILL — Calcium Chloride Inj 10%: INTRAVENOUS | Qty: 10 | Status: AC

## 2022-06-05 MED FILL — Sodium Bicarbonate IV Soln 8.4%: INTRAVENOUS | Qty: 50 | Status: AC

## 2022-06-05 MED FILL — Mannitol IV Soln 20%: INTRAVENOUS | Qty: 500 | Status: AC

## 2022-06-05 MED FILL — Heparin Sodium (Porcine) Inj 1000 Unit/ML: INTRAMUSCULAR | Qty: 20 | Status: AC

## 2022-06-05 MED FILL — Electrolyte-R (PH 7.4) Solution: INTRAVENOUS | Qty: 3000 | Status: AC

## 2022-06-05 MED FILL — Sodium Chloride IV Soln 0.9%: INTRAVENOUS | Qty: 2000 | Status: AC

## 2022-06-05 NOTE — Plan of Care (Signed)
  Problem: Education: Goal: Understanding of CV disease, CV risk reduction, and recovery process will improve Outcome: Progressing Goal: Individualized Educational Video(s) Outcome: Progressing   Problem: Activity: Goal: Ability to return to baseline activity level will improve Outcome: Progressing   Problem: Cardiovascular: Goal: Ability to achieve and maintain adequate cardiovascular perfusion will improve Outcome: Progressing Goal: Vascular access site(s) Level 0-1 will be maintained Outcome: Progressing   Problem: Health Behavior/Discharge Planning: Goal: Ability to safely manage health-related needs after discharge will improve Outcome: Progressing   Problem: Education: Goal: Will demonstrate proper wound care and an understanding of methods to prevent future damage Outcome: Progressing Goal: Knowledge of disease or condition will improve Outcome: Progressing Goal: Knowledge of the prescribed therapeutic regimen will improve Outcome: Progressing Goal: Individualized Educational Video(s) Outcome: Progressing   Problem: Activity: Goal: Risk for activity intolerance will decrease Outcome: Progressing   Problem: Cardiac: Goal: Will achieve and/or maintain hemodynamic stability Outcome: Progressing   Problem: Clinical Measurements: Goal: Postoperative complications will be avoided or minimized Outcome: Progressing   Problem: Respiratory: Goal: Respiratory status will improve Outcome: Progressing   Problem: Skin Integrity: Goal: Wound healing without signs and symptoms of infection Outcome: Progressing Goal: Risk for impaired skin integrity will decrease Outcome: Progressing   Problem: Urinary Elimination: Goal: Ability to achieve and maintain adequate renal perfusion and functioning will improve Outcome: Progressing   Problem: Education: Goal: Knowledge of General Education information will improve Description: Including pain rating scale, medication(s)/side  effects and non-pharmacologic comfort measures Outcome: Progressing   Problem: Health Behavior/Discharge Planning: Goal: Ability to manage health-related needs will improve Outcome: Progressing   Problem: Clinical Measurements: Goal: Ability to maintain clinical measurements within normal limits will improve Outcome: Progressing Goal: Will remain free from infection Outcome: Progressing Goal: Diagnostic test results will improve Outcome: Progressing Goal: Respiratory complications will improve Outcome: Progressing Goal: Cardiovascular complication will be avoided Outcome: Progressing   Problem: Activity: Goal: Risk for activity intolerance will decrease Outcome: Progressing   Problem: Nutrition: Goal: Adequate nutrition will be maintained Outcome: Progressing   Problem: Coping: Goal: Level of anxiety will decrease Outcome: Progressing   Problem: Elimination: Goal: Will not experience complications related to bowel motility Outcome: Progressing Goal: Will not experience complications related to urinary retention Outcome: Progressing   Problem: Pain Managment: Goal: General experience of comfort will improve Outcome: Progressing   Problem: Safety: Goal: Ability to remain free from injury will improve Outcome: Progressing   Problem: Skin Integrity: Goal: Risk for impaired skin integrity will decrease Outcome: Progressing   Problem: Education: Goal: Will demonstrate proper wound care and an understanding of methods to prevent future damage Outcome: Progressing Goal: Knowledge of disease or condition will improve Outcome: Progressing Goal: Knowledge of the prescribed therapeutic regimen will improve Outcome: Progressing Goal: Individualized Educational Video(s) Outcome: Progressing   Problem: Activity: Goal: Risk for activity intolerance will decrease Outcome: Progressing   Problem: Cardiac: Goal: Will achieve and/or maintain hemodynamic stability Outcome:  Progressing   Problem: Clinical Measurements: Goal: Postoperative complications will be avoided or minimized Outcome: Progressing   Problem: Respiratory: Goal: Respiratory status will improve Outcome: Progressing   Problem: Skin Integrity: Goal: Wound healing without signs and symptoms of infection Outcome: Progressing Goal: Risk for impaired skin integrity will decrease Outcome: Progressing   Problem: Urinary Elimination: Goal: Ability to achieve and maintain adequate renal perfusion and functioning will improve Outcome: Progressing

## 2022-06-05 NOTE — Progress Notes (Signed)
      [address]       [place] [address]             [phone]    Mr. Hidrogo developed atrial fibrillation with ventricular rates 130- 150.  His blood pressure is stable with systolic around 130.  Will load with IV amiodarone.  Hold on transfer until the heart rate is under better control.Marland Kitchen  K+ is 4.5 and Mg++ yesterday was 2.4.  M/ Hedwig Morton, PA-C

## 2022-06-05 NOTE — Progress Notes (Addendum)
[facility] DAILY ICU PROGRESS NOTE                   [address]            [place] [address]          [phone]   2 Days Post-Op Procedure(s) (LRB): CORONARY ARTERY BYPASS GRAFTING (CABG) TIMES 4, USING LEFT INTERNAL MAMMARY ARTERY AND GREATER SAPHENOUS VEIN (N/A) TRANSESOPHAGEAL ECHOCARDIOGRAM (TEE) (N/A)  Total Length of Stay:  LOS: 2 days   Subjective: Walked in the unit this morning. Up in the bedside chair now.  Pain controlled.  No BM or flatus yet. O2 at 6L/[place].   Objective: Vital signs in last 24 hours: Temp:  [97.8 F (36.6 C)-100 F (37.8 C)] 98.4 F (36.9 C) (07/14 0300) Pulse Rate:  [67-85] 74 (07/14 0735) Cardiac Rhythm: Normal sinus rhythm (07/14 0800) Resp:  [10-27] 18 (07/14 0735) BP: (104-144)/(57-113) 126/57 (07/14 0735) SpO2:  [84 %-93 %] 92 % (07/14 0735) Arterial Line BP: (121-153)/(58-77) 149/72 (07/13 1500) Weight:  [83.1 kg] 83.1 kg (07/14 0500)  Filed Weights   06/03/22 0624 06/05/22 0500  Weight: 83 kg 83.1 kg    Weight change:       Intake/Output from previous day: 07/13 0701 - 07/14 0700 In: 1928 [P.O.:1320; I.V.:285.9; IV Piggyback:322.1] Out: 1405 [Urine:1055; Chest Tube:350]  Intake/Output this shift: No intake/output data recorded.  Current Meds: Scheduled Meds:  acetaminophen  1,000 mg Oral Q6H   Or   acetaminophen (TYLENOL) oral liquid 160 mg/5 mL  1,000 mg Per Tube Q6H   aspirin EC  325 mg Oral Daily   Or   aspirin  324 mg Per Tube Daily   atorvastatin  80 mg Oral Daily   bisacodyl  10 mg Oral Daily   Or   bisacodyl  10 mg Rectal Daily   Chlorhexidine Gluconate Cloth  6 each Topical Daily   docusate sodium  200 mg Oral Daily   enoxaparin (LOVENOX) injection  30 mg Subcutaneous QHS   insulin aspart  0-24 Units Subcutaneous Q4H   insulin detemir  15 Units Subcutaneous Daily   insulin detemir  20 Units Subcutaneous QHS   methocarbamol  500 mg Oral TID   metoprolol tartrate  25 mg Oral BID    pantoprazole  40 mg Oral Daily   sodium chloride flush  10-40 mL Intracatheter Q12H   sodium chloride flush  3 mL Intravenous Q12H   Continuous Infusions:  sodium chloride 5 mL/hr at 06/05/22 0700   sodium chloride     sodium chloride     insulin Stopped (06/04/22 1251)   lactated ringers     lactated ringers Stopped (06/03/22 1547)   lactated ringers Stopped (06/04/22 1230)   PRN Meds:.sodium chloride, dextrose, lactated ringers, metoprolol tartrate, midazolam, morphine injection, ondansetron (ZOFRAN) IV, mouth rinse, oxyCODONE, sodium chloride flush, sodium chloride flush, traMADol  General appearance: alert, cooperative, and no distress Neurologic: intact Heart: Stable SR, no further SVT. Lungs: breath sounds clear anterior.   CT drainage 40ml past 12 hours.  Abdomen: soft, NT Extremities: no peripheral edema, LLE EVH incision is intact and dry Wound: the sternotomy incision is covered with a dry Aquacel dressing.   Lab Results: CBC: Recent Labs    06/04/22 1602 06/05/22 0407  WBC 13.6* 11.0*  HGB 11.5* 10.4*  HCT 33.2* 30.7*  PLT 217 190    BMET:  Recent Labs    06/04/22 1602 06/05/22 0407  NA 136  134*  K 5.0 4.5  CL 103 100  CO2 23 24  GLUCOSE 177* 143*  BUN 24* 28*  CREATININE 2.00* 1.94*  CALCIUM 8.7* 8.4*     CMET: Lab Results  Component Value Date   WBC 11.0 (H) 06/05/2022   HGB 10.4 (L) 06/05/2022   HCT 30.7 (L) 06/05/2022   PLT 190 06/05/2022   GLUCOSE 143 (H) 06/05/2022   CHOL 90 (L) 03/27/2022   TRIG 136 03/27/2022   HDL 33 (L) 03/27/2022   LDLCALC 33 03/27/2022   ALT 18 05/29/2022   AST 19 05/29/2022   NA 134 (L) 06/05/2022   K 4.5 06/05/2022   CL 100 06/05/2022   CREATININE 1.94 (H) 06/05/2022   BUN 28 (H) 06/05/2022   CO2 24 06/05/2022   TSH 0.648 03/27/2022   INR 1.4 (H) 06/03/2022   HGBA1C 6.6 (H) 05/29/2022      PT/INR:  Recent Labs    06/03/22 1356  LABPROT 17.2*  INR 1.4*    Radiology: DG Chest Port 1  View  Result Date: 06/05/2022 CLINICAL DATA:  Chest tube EXAM: PORTABLE CHEST 1 VIEW COMPARISON:  Chest x-ray dated June 04, 2022 FINDINGS: Stable cardiac and mediastinal contours post median sternotomy. Stable position of right IJ line and chest tube. Mild left-greater-than-right basilar opacities, likely atelectasis. No large pleural effusion or pneumothorax. IMPRESSION: Stable support devices. Electronically Signed   By: Allegra Lai M.D.   On: 06/05/2022 08:10     Assessment/Plan: S/P Procedure(s) (LRB): CORONARY ARTERY BYPASS GRAFTING (CABG) TIMES 4, USING LEFT INTERNAL MAMMARY ARTERY AND GREATER SAPHENOUS VEIN (N/A) TRANSESOPHAGEAL ECHOCARDIOGRAM (TEE) (N/A)  -Postop day 2 CABG x4 with initial presentation of angina pectoris.  Has preserved biventricular function.  Stable HR and BP .  DC the chest tubes and cordis today.  Continue ASA, metoprolol, atorvastatin. Advance activity. Plan transfer to floor today.   -History of type 2 diabetes-acceptable control on BID Levemir.   -Chronic kidney disease: Baseline creat around 1.3-1.5.  Increased to 2.0 last evening but trending back down now. Monitor.   -PULM- O2 at 6L [place]. Good effort with IS. Does not appear volume overloaded.   -Expected acute blood loss anemia-mild and tolerating well. Monitor  -DVT prophylaxis -on daily enoxaparin.  Mobilize   Leary Roca, PA-C [phone]  06/05/2022 8:18 AM  Agree with above Creat trending down.  Will hold diuresis today Will remove CT Floor today  Laiza Veenstra O Sahand Gosch

## 2022-06-06 LAB — CBC
HCT: 28.9 % — ABNORMAL LOW (ref 39.0–52.0)
Hemoglobin: 10.1 g/dL — ABNORMAL LOW (ref 13.0–17.0)
MCH: 33.6 pg (ref 26.0–34.0)
MCHC: 34.9 g/dL (ref 30.0–36.0)
MCV: 96 fL (ref 80.0–100.0)
Platelets: 191 10*3/uL (ref 150–400)
RBC: 3.01 MIL/uL — ABNORMAL LOW (ref 4.22–5.81)
RDW: 13.2 % (ref 11.5–15.5)
WBC: 9.7 10*3/uL (ref 4.0–10.5)
nRBC: 0 % (ref 0.0–0.2)

## 2022-06-06 LAB — GLUCOSE, CAPILLARY
Glucose-Capillary: 101 mg/dL — ABNORMAL HIGH (ref 70–99)
Glucose-Capillary: 176 mg/dL — ABNORMAL HIGH (ref 70–99)
Glucose-Capillary: 170 mg/dL — ABNORMAL HIGH (ref 70–99)
Glucose-Capillary: 104 mg/dL — ABNORMAL HIGH (ref 70–99)
Glucose-Capillary: 143 mg/dL — ABNORMAL HIGH (ref 70–99)

## 2022-06-06 LAB — BASIC METABOLIC PANEL
Anion gap: 7 (ref 5–15)
BUN: 24 mg/dL — ABNORMAL HIGH (ref 8–23)
CO2: 26 mmol/L (ref 22–32)
Calcium: 8.4 mg/dL — ABNORMAL LOW (ref 8.9–10.3)
Chloride: 104 mmol/L (ref 98–111)
Creatinine, Ser: 1.69 mg/dL — ABNORMAL HIGH (ref 0.61–1.24)
GFR, Estimated: 43 mL/min — ABNORMAL LOW (ref >60)
Glucose, Bld: 87 mg/dL (ref 70–99)
Potassium: 4.1 mmol/L (ref 3.5–5.1)
Sodium: 137 mmol/L (ref 135–145)

## 2022-06-06 MED ORDER — AMIODARONE HCL 200 MG PO TABS
400.0000 mg | ORAL_TABLET | Freq: Two times a day (BID) | ORAL | Status: DC
  Administered 2022-06-06 – 2022-06-08 (×5): 400 mg via ORAL
  Filled 2022-06-06 (×5): qty 2

## 2022-06-06 MED ORDER — POTASSIUM CHLORIDE CRYS ER 20 MEQ PO TBCR
40.0000 meq | EXTENDED_RELEASE_TABLET | Freq: Every day | ORAL | Status: DC
  Administered 2022-06-06 – 2022-06-08 (×3): 40 meq via ORAL
  Filled 2022-06-06 (×3): qty 2

## 2022-06-06 MED ORDER — FUROSEMIDE 40 MG PO TABS
40.0000 mg | ORAL_TABLET | Freq: Every day | ORAL | Status: DC
  Administered 2022-06-06 – 2022-06-08 (×3): 40 mg via ORAL
  Filled 2022-06-06 (×3): qty 1

## 2022-06-06 NOTE — Progress Notes (Signed)
Report called to receiving [id number]. PAtient with no complaints at this time. Spouse at bedside. Will tx via WC.

## 2022-06-06 NOTE — Progress Notes (Signed)
Patient arrived from [facility] to [phone] after CABGx4 w/Dr. Cliffton Asters on 07/12.  Telemetry monitor applied and CCMD notified.  CHG bath and skin assessment completed.  Patient oriented to unit and room to include call light and phone.  All needs addressed.

## 2022-06-06 NOTE — Progress Notes (Signed)
      [address]       [place] [address]             [phone]                 3 Days Post-Op Procedure(s) (LRB): CORONARY ARTERY BYPASS GRAFTING (CABG) TIMES 4, USING LEFT INTERNAL MAMMARY ARTERY AND GREATER SAPHENOUS VEIN (N/A) TRANSESOPHAGEAL ECHOCARDIOGRAM (TEE) (N/A)   Events: No events.  Back in sinus _______________________________________________________________ Vitals: BP 122/70 (BP Location: Left Arm)   Pulse 71   Temp 98.7 F (37.1 C) (Oral)   Resp 16   Ht 5\' 9"  (1.753 m)   Wt 83.1 kg   SpO2 94%   BMI 27.05 kg/m  Filed Weights   06/03/22 0624 06/05/22 0500 06/06/22 0500  Weight: 83 kg 83.1 kg 83.1 kg     - Neuro: alert NAd  - Cardiovascular: sinus  Drips: amio.      - Pulm: EWOB    ABG    Component Value Date/Time   PHART 7.306 (L) 06/03/2022 1905   PCO2ART 37.9 06/03/2022 1905   PO2ART 74 (L) 06/03/2022 1905   HCO3 18.9 (L) 06/03/2022 1905   TCO2 20 (L) 06/03/2022 1905   ACIDBASEDEF 7.0 (H) 06/03/2022 1905   O2SAT 93 06/03/2022 1905    - Abd: ND - Extremity: warm  .Intake/Output      07/14 0701 07/15 0700 07/15 0701 07/16 0700   P.Goodwin. 340 240   I.V. (mL/kg) 523.6 (6.3) 16.6 (0.2)   IV Piggyback     Total Intake(mL/kg) 863.6 (10.4) 256.6 (3.1)   Urine (mL/kg/hr) 2115 (1.1)    Chest Tube     Total Output 2115    Net -1251.4 +256.6           _______________________________________________________________ Labs:    Latest Ref Rng & Units 06/06/2022   12:33 AM 06/05/2022    4:07 AM 06/04/2022    4:02 PM  CBC  WBC 4.0 - 10.5 K/uL 9.7  11.0  13.6   Hemoglobin 13.0 - 17.0 g/dL 06/06/2022  09.3  26.7   Hematocrit 39.0 - 52.0 % 28.9  30.7  33.2   Platelets 150 - 400 K/uL 191  190  217       Latest Ref Rng & Units 06/06/2022   12:33 AM 06/05/2022    4:07 AM 06/04/2022    4:02 PM  CMP  Glucose 70 - 99 mg/dL 87  06/06/2022  580   BUN 8 - 23 mg/dL 24  28  24    Creatinine 0.61 - 1.24 mg/dL 998   3.38   Sodium 135 - 145  mmol/L 137  134  136   Potassium 3.5 - 5.1 mmol/L 4.1  4.5  5.0   Chloride 98 - 111 mmol/L 104  100  103   CO2 22 - 32 mmol/L 26  24  23    Calcium 8.9 - 10.3 mg/dL 8.4  8.4  8.7     CXR: -  _______________________________________________________________  Assessment and Plan: POD 3 s/p CABG  Neuro: pain controlled CV: amio po.  On A/S/BB Pulm: wean O2 Renal: creat down, gentle diuresis GI: on diet Heme: stable ID: [email or web address] Endo: SSI Dispo: floor today   Dustin Goodwin Dustin Goodwin 06/06/2022 10:28 AM

## 2022-06-07 ENCOUNTER — Encounter: Payer: Self-pay | Admitting: Cardiovascular Disease

## 2022-06-07 LAB — GLUCOSE, CAPILLARY
Glucose-Capillary: 121 mg/dL — ABNORMAL HIGH (ref 70–99)
Glucose-Capillary: 181 mg/dL — ABNORMAL HIGH (ref 70–99)
Glucose-Capillary: 198 mg/dL — ABNORMAL HIGH (ref 70–99)
Glucose-Capillary: 72 mg/dL (ref 70–99)
Glucose-Capillary: 90 mg/dL (ref 70–99)

## 2022-06-07 MED ORDER — GLIMEPIRIDE 4 MG PO TABS
4.0000 mg | ORAL_TABLET | Freq: Every day | ORAL | Status: DC
Start: 2022-06-07 — End: 2022-06-08
  Administered 2022-06-07 – 2022-06-08 (×2): 4 mg via ORAL
  Filled 2022-06-07 (×2): qty 1

## 2022-06-07 MED ORDER — FE FUMARATE-B12-VIT C-FA-IFC PO CAPS
1.0000 | ORAL_CAPSULE | Freq: Two times a day (BID) | ORAL | Status: DC
  Administered 2022-06-07 – 2022-06-08 (×3): 1 via ORAL
  Filled 2022-06-07 (×3): qty 1

## 2022-06-07 NOTE — Progress Notes (Signed)
Patient ambulated in hallway 270 feet with walker and nursing staff. Back in room call bell with in reach. Rolen Conger, Randall An RN

## 2022-06-07 NOTE — Progress Notes (Addendum)
[address]       [place] [address]             [phone]      4 Days Post-Op Procedure(s) (LRB): CORONARY ARTERY BYPASS GRAFTING (CABG) TIMES 4, USING LEFT INTERNAL MAMMARY ARTERY AND GREATER SAPHENOUS VEIN (N/A) TRANSESOPHAGEAL ECHOCARDIOGRAM (TEE) (N/A) Subjective: Feeling better overall  Objective: Vital signs in last 24 hours: Temp:  [97.8 F (36.6 C)-98.5 F (36.9 C)] 98.5 F (36.9 C) (07/16 0442) Pulse Rate:  [66-79] 75 (07/16 0442) Cardiac Rhythm: Normal sinus rhythm (07/15 2015) Resp:  [15-20] 18 (07/16 0442) BP: (122-146)/(62-97) 124/83 (07/16 0442) SpO2:  [92 %-97 %] 95 % (07/16 0442) Weight:  [80.6 kg] 80.6 kg (07/16 0442)  Hemodynamic parameters for last 24 hours:    Intake/Output from previous day: 07/15 0701 - 07/16 0700 In: 446.7 [P.O.:360; I.V.:86.7] Out: 1750 [Urine:1750] Intake/Output this shift: No intake/output data recorded.  General appearance: alert, cooperative, and no distress Heart: regular rate and rhythm Lungs: clear to auscultation bilaterally Abdomen: benign Extremities: no edema Wound: healing well  Lab Results: Recent Labs    06/05/22 0407 06/06/22 0033  WBC 11.0* 9.7  HGB 10.4* 10.1*  HCT 30.7* 28.9*  PLT 190 191   BMET:  Recent Labs    06/05/22 0407 06/06/22 0033  NA 134* 137  K 4.5 4.1  CL 100 104  CO2 24 26  GLUCOSE 143* 87  BUN 28* 24*  CREATININE 1.94* 1.69*  CALCIUM 8.4* 8.4*    PT/INR: No results for input(s): "LABPROT", "INR" in the last 72 hours. ABG    Component Value Date/Time   PHART 7.306 (L) 06/03/2022 1905   HCO3 18.9 (L) 06/03/2022 1905   TCO2 20 (L) 06/03/2022 1905   ACIDBASEDEF 7.0 (H) 06/03/2022 1905   O2SAT 93 06/03/2022 1905   CBG (last 3)  Recent Labs    06/06/22 2014 06/07/22 0018 06/07/22 0440  GLUCAP 143* 90 72    Meds Scheduled Meds:  acetaminophen  1,000 mg Oral Q6H   Or   acetaminophen (TYLENOL) oral liquid 160 mg/5 mL  1,000 mg Per Tube  Q6H   amiodarone  400 mg Oral BID   aspirin EC  325 mg Oral Daily   Or   aspirin  324 mg Per Tube Daily   atorvastatin  80 mg Oral Daily   bisacodyl  10 mg Oral Daily   Or   bisacodyl  10 mg Rectal Daily   docusate sodium  200 mg Oral Daily   enoxaparin (LOVENOX) injection  30 mg Subcutaneous QHS   furosemide  40 mg Oral Daily   insulin aspart  0-24 Units Subcutaneous Q4H   insulin detemir  15 Units Subcutaneous Daily   insulin detemir  20 Units Subcutaneous QHS   methocarbamol  500 mg Oral TID   metoprolol tartrate  25 mg Oral BID   pantoprazole  40 mg Oral Daily   potassium chloride  40 mEq Oral Daily   sodium chloride flush  10-40 mL Intracatheter Q12H   sodium chloride flush  3 mL Intravenous Q12H   sodium chloride flush  3 mL Intravenous Q12H   Continuous Infusions:  sodium chloride Stopped (06/05/22 0843)   sodium chloride     sodium chloride     sodium chloride     insulin Stopped (06/04/22 1251)   lactated ringers     lactated ringers Stopped (06/03/22 1547)   lactated ringers Stopped (06/04/22 1230)  PRN Meds:.sodium chloride, sodium chloride, dextrose, lactated ringers, metoprolol tartrate, midazolam, morphine injection, ondansetron (ZOFRAN) IV, mouth rinse, oxyCODONE, sodium chloride flush, sodium chloride flush, sodium chloride flush, traMADol  Xrays No results found.  Assessment/Plan: S/P Procedure(s) (LRB): CORONARY ARTERY BYPASS GRAFTING (CABG) TIMES 4, USING LEFT INTERNAL MAMMARY ARTERY AND GREATER SAPHENOUS VEIN (N/A) TRANSESOPHAGEAL ECHOCARDIOGRAM (TEE) (N/A)  POD#4  1 afeb, VSS s BP 120's-140's, sinus rhythm 2 sats good on RA 3 weight below preop, good UOP 4 creat improving trend 1.69, peaked at 2.0, was 1.74 on 05/29/22 5 leukocytosis resolved 6 expected ABLA - slow trend lower, not at transfusion threshold, will start trinsicon 7 BS controlled on insulin and SSI- was on metformin and amaryl at home- will d/c insulin and resume amaryl for now , if  creat conts to improve may be able to resume metformin soon 8 cont rehab and pulm hygiene 9 poss home in am if no new issues and rhythm remains stable   LOS: 4 days    Rowe Clack PA-C Pager [phone] 06/07/2022    Agree with above Doing well Continue PT, IS, ambulation Dispo planning  Rema Lievanos O Teddy Rebstock

## 2022-06-08 ENCOUNTER — Other Ambulatory Visit (HOSPITAL_COMMUNITY): Payer: Self-pay

## 2022-06-08 LAB — CBC
HCT: 30.1 % — ABNORMAL LOW (ref 39.0–52.0)
Hemoglobin: 10.3 g/dL — ABNORMAL LOW (ref 13.0–17.0)
MCH: 33 pg (ref 26.0–34.0)
MCHC: 34.2 g/dL (ref 30.0–36.0)
MCV: 96.5 fL (ref 80.0–100.0)
Platelets: 264 10*3/uL (ref 150–400)
RBC: 3.12 MIL/uL — ABNORMAL LOW (ref 4.22–5.81)
RDW: 13.5 % (ref 11.5–15.5)
WBC: 7 10*3/uL (ref 4.0–10.5)
nRBC: 0 % (ref 0.0–0.2)

## 2022-06-08 LAB — BASIC METABOLIC PANEL
Anion gap: 8 (ref 5–15)
BUN: 30 mg/dL — ABNORMAL HIGH (ref 8–23)
CO2: 27 mmol/L (ref 22–32)
Calcium: 8.8 mg/dL — ABNORMAL LOW (ref 8.9–10.3)
Chloride: 105 mmol/L (ref 98–111)
Creatinine, Ser: 1.79 mg/dL — ABNORMAL HIGH (ref 0.61–1.24)
GFR, Estimated: 40 mL/min — ABNORMAL LOW (ref >60)
Glucose, Bld: 131 mg/dL — ABNORMAL HIGH (ref 70–99)
Potassium: 4.6 mmol/L (ref 3.5–5.1)
Sodium: 140 mmol/L (ref 135–145)

## 2022-06-08 LAB — GLUCOSE, CAPILLARY
Glucose-Capillary: 104 mg/dL — ABNORMAL HIGH (ref 70–99)
Glucose-Capillary: 143 mg/dL — ABNORMAL HIGH (ref 70–99)
Glucose-Capillary: 180 mg/dL — ABNORMAL HIGH (ref 70–99)

## 2022-06-08 MED ORDER — AMIODARONE HCL 200 MG PO TABS
400.0000 mg | ORAL_TABLET | Freq: Two times a day (BID) | ORAL | 1 refills | Status: DC
  Filled 2022-06-08: qty 60, 31d supply, fill #0

## 2022-06-08 MED ORDER — TRAMADOL HCL 50 MG PO TABS
50.0000 mg | ORAL_TABLET | Freq: Four times a day (QID) | ORAL | 0 refills | Status: AC | PRN
  Filled 2022-06-08: qty 28, 7d supply, fill #0

## 2022-06-08 MED ORDER — FE FUMARATE-B12-VIT C-FA-IFC PO CAPS
1.0000 | ORAL_CAPSULE | Freq: Two times a day (BID) | ORAL | 0 refills | Status: DC
  Filled 2022-06-08: qty 60, 30d supply, fill #0

## 2022-06-08 MED ORDER — ACETAMINOPHEN 325 MG PO TABS: 650.0000 mg | ORAL_TABLET | ORAL | Status: DC | PRN

## 2022-06-08 MED ORDER — ATORVASTATIN CALCIUM 80 MG PO TABS
80.0000 mg | ORAL_TABLET | Freq: Every day | ORAL | 2 refills | Status: DC
  Filled 2022-06-08: qty 30, 30d supply, fill #0

## 2022-06-08 NOTE — Progress Notes (Signed)
      [address]       [place] [address]             [phone]      5 Days Post-Op Procedure(s) (LRB): CORONARY ARTERY BYPASS GRAFTING (CABG) TIMES 4, USING LEFT INTERNAL MAMMARY ARTERY AND GREATER SAPHENOUS VEIN (N/A) TRANSESOPHAGEAL ECHOCARDIOGRAM (TEE) (N/A) Subjective: Had a good day yesterday, walked several times. No new concerns. BM x 2. On room air without dyspnea.  Objective: Vital signs in last 24 hours: Temp:  [97.6 F (36.4 C)-98.7 F (37.1 C)] 98.1 F (36.7 C) (07/17 0737) Pulse Rate:  [71-81] 71 (07/17 0737) Cardiac Rhythm: Normal sinus rhythm (07/16 1955) Resp:  [18-20] 18 (07/17 0737) BP: (112-134)/(62-78) 134/78 (07/17 0737) SpO2:  [94 %-97 %] 95 % (07/17 0737) Weight:  [79.3 kg] 79.3 kg (07/17 0412)     Intake/Output from previous day: 07/16 0701 - 07/17 0700 In: -  Out: 1750 [Urine:1750] Intake/Output this shift: No intake/output data recorded.  General appearance: alert, cooperative, and no distress Neurologic: intact Heart: Stable SR, no further AF Lungs: breath sounds clear anterior.    Abdomen: soft, NT Extremities: no peripheral edema, LLE EVH incision is intact and dry Wound: the sternotomy incision and CT sites are intact and dry  Lab Results: Recent Labs    06/06/22 0033 06/08/22 0256  WBC 9.7 7.0  HGB 10.1* 10.3*  HCT 28.9* 30.1*  PLT 191 264   BMET:  Recent Labs    06/06/22 0033 06/08/22 0256  NA 137 140  K 4.1 4.6  CL 104 105  CO2 26 27  GLUCOSE 87 131*  BUN 24* 30*  CREATININE 1.69* 1.79*  CALCIUM 8.4* 8.8*    PT/INR: No results for input(s): "LABPROT", "INR" in the last 72 hours. ABG    Component Value Date/Time   PHART 7.306 (L) 06/03/2022 1905   HCO3 18.9 (L) 06/03/2022 1905   TCO2 20 (L) 06/03/2022 1905   ACIDBASEDEF 7.0 (H) 06/03/2022 1905   O2SAT 93 06/03/2022 1905   CBG (last 3)  Recent Labs    06/07/22 2029 06/08/22 0044 06/08/22 0412  GLUCAP 198* 180* 104*     Assessment/Plan: S/P Procedure(s) (LRB): CORONARY ARTERY BYPASS GRAFTING (CABG) TIMES 4, USING LEFT INTERNAL MAMMARY ARTERY AND GREATER SAPHENOUS VEIN (N/A) TRANSESOPHAGEAL ECHOCARDIOGRAM (TEE) (N/A)  -Postop day 5 CABG x4 with initial presentation of angina pectoris.  Has preserved biventricular function.  Stable HR and BP.  Continue ASA, metoprolol, atorvastatin. Advance activity. Plan discharge to home today.   -Post-op a-fib-episode on POD2 treated with amiodarone without recurrence. Discharge on oral amiodarone 400mg  BID for 5 days then 200mg  BID.    -History of type 2 diabetes-resume amaryl and Ozempic.  Will ask him to hold off on restarting the metformin for 5 days to allow further recovery of renal function.    -Chronic kidney disease: Baseline creat around 1.3-1.5.  Increased to 2.0 post-op. Trending back toward baseline   -PULM- now on RA without dyspnea.  Does not appear volume overloaded. Continue IS at home   -Expected acute blood loss anemia-mild and tolerating well. No indication for intervention.   -Disposition- discharge to home. Instructions given. Follow up arranged.      LOS: 5 days    , PA-C [phone] 06/08/2022

## 2022-06-08 NOTE — TOC Transition Note (Signed)
Transition of Care (TOC) - CM/SW Discharge Note Donn Pierini RN, BSN Transitions of Care Unit 4E- RN Case Manager See Treatment Team for direct phone #    Patient Details  Name: Tanor Glaspy MRN: [id number] Date of Birth: 1951-02-18  Transition of Care [facility]) CM/SW Contact:  Darrold Span, RN Phone Number: 06/08/2022, 10:27 AM   Clinical Narrative:    Pt stable for transition home today, order for rollator has been placed. In house provider contacted and to deliver rollator to room prior to discharge.  Wife coming to transport home.   No further TOC needs noted.    Final next level of care: Home/Self Care Barriers to Discharge: No Barriers Identified   Patient Goals and CMS Choice Patient states their goals for this hospitalization and ongoing recovery are:: return home      Discharge Placement                 Home      Discharge Plan and Services   Discharge Planning Services: CM Consult Post Acute Care Choice: Durable Medical Equipment          DME Arranged: Walker rolling with seat DME Agency: [employer] Date DME Agency Contacted: 06/08/22 Time DME Agency Contacted: 0900 Representative spoke with at DME Agency: Elease Hashimoto HH Arranged: NA HH Agency: NA        Social Determinants of Health (SDOH) Interventions     Readmission Risk Interventions    06/08/2022   10:27 AM  Readmission Risk Prevention Plan  Transportation Screening Complete  PCP or Specialist Appt within 5-7 Days Complete  Home Care Screening Complete  Medication Review (RN CM) Complete

## 2022-06-08 NOTE — Care Management Important Message (Signed)
Important Message  Patient Details  Name: Dustin Goodwin MRN: [id number] Date of Birth: 02-05-1951   Medicare Important Message Given:  Yes     Renie Ora 06/08/2022, 10:31 AM

## 2022-06-08 NOTE — Progress Notes (Signed)
CARDIAC REHAB PHASE I   D/c education completed with pt and wife. Pt educated on importance of site care and monitoring incisions daily. Encouraged continued IS use, walks, and sternal precautions. Pt given in-the-tube sheet along with heart healthy and diabetic diets. Reviewed restrictions and exercise guidelines. Pt states walker at home if needed. Will refer to [facility] [place].  [id number] Reynold Bowen, RN BSN 06/08/2022 10:33 AM

## 2022-06-10 ENCOUNTER — Ambulatory Visit: Payer: Medicare Other | Admitting: Cardiovascular Disease

## 2022-06-11 ENCOUNTER — Telehealth (HOSPITAL_COMMUNITY): Payer: Self-pay

## 2022-06-19 ENCOUNTER — Ambulatory Visit (INDEPENDENT_AMBULATORY_CARE_PROVIDER_SITE_OTHER): Payer: Self-pay | Admitting: Thoracic Surgery (Cardiothoracic Vascular Surgery)

## 2022-06-19 DIAGNOSIS — I25119 Atherosclerotic heart disease of native coronary artery with unspecified angina pectoris: Secondary | ICD-10-CM

## 2022-06-19 NOTE — Progress Notes (Signed)
     [address]       [place] [address]             [phone]       Patient: Home Provider: Office Consent for Telemedicine visit obtained.  Today's visit was completed via a real-time telehealth (see specific modality noted below). The patient/authorized person provided oral consent at the time of the visit to engage in a telemedicine encounter with the present provider at [facility]. The patient/authorized person was informed of the potential benefits, limitations, and risks of telemedicine. The patient/authorized person expressed understanding that the laws that protect confidentiality also apply to telemedicine. The patient/authorized person acknowledged understanding that telemedicine does not provide emergency services and that he or she would need to call 911 or proceed to the nearest hospital for help if such a need arose.   Total time spent in the clinical discussion 10 minutes.  Telehealth Modality: Phone visit (audio only)  I had a telephone visit with  Dustin Goodwin who is s/p CABG.  Overall doing well.   Pain is minimal.  Ambulating well. Vitals have been stable.  Dustin Goodwin will see [place] back in 1 month with a chest x-ray for cardiac rehab clearance.  Dustin Goodwin

## 2022-06-21 NOTE — Progress Notes (Unsigned)
Cardiology Clinic Note   Patient Name: Barton Canlas Date of Encounter: 06/24/2022  Primary Care Provider:  Olena Mater, MD Primary Cardiologist:  Shelva Majestic, MD  Patient Profile    Zylen Hoganson 71 year old male presents the clinic today for follow-up evaluation of his coronary artery disease status post CABG x4 06/03/2022  Past Medical History    Past Medical History:  Diagnosis Date   Arthritis    Coronary artery disease    Diabetes mellitus without complication ([place])    Hyperlipidemia    Hypertension    Neuropathy    related to DM   Past Surgical History:  Procedure Laterality Date   ANKLE SURGERY Right 1978   fell from lift-leg and ankle surgery   BACK SURGERY  2017   CORONARY ARTERY BYPASS GRAFT N/A 06/03/2022   Procedure: CORONARY ARTERY BYPASS GRAFTING (CABG) TIMES 4, USING LEFT INTERNAL MAMMARY ARTERY AND GREATER SAPHENOUS VEIN;  Surgeon: Lajuana Matte, MD;  Location: [facility];  Service: Open Heart Surgery;  Laterality: N/A;   FOOT SURGERY Right 1963   HERNIA REPAIR     umbilical and inguinal   LEFT HEART CATH AND CORONARY ANGIOGRAPHY N/A 05/06/2022   Procedure: LEFT HEART CATH AND CORONARY ANGIOGRAPHY;  Surgeon: Troy Sine, MD;  Location: [facility];  Service: Cardiovascular;  Laterality: N/A;   LEG SURGERY Right 1978   fell from a lift-skin graft needed   TEE WITHOUT CARDIOVERSION N/A 06/03/2022   Procedure: TRANSESOPHAGEAL ECHOCARDIOGRAM (TEE);  Surgeon: Lajuana Matte, MD;  Location: [facility];  Service: Open Heart Surgery;  Laterality: N/A;    Allergies  No Known Allergies  History of Present Illness     Krishi Antelo is a PMH of coronary artery disease, hyperlipidemia, type 2 diabetes, CKD, and postoperative SVT.  He reported increasing dyspnea over the previous year.  He underwent cardiac catheterization which showed multivessel coronary artery disease.  His echocardiogram showed preserved biventricular  function with no significant valvular abnormalities.  CABG was recommended.  A four-vessel bypass was discussed.  He was admitted to the hospital on 06/03/2022 and underwent four-vessel CABG.  (LIMA-LAD, SVG-first and third obtuse marginal and posterior descending artery).  He remained hemodynamically stable.  He weaned well on the ventilator and was extubated routinely.  He was noted to have a brief episode of SVT in the early morning hours postop day 1.  He was treated with IV metoprolol and had no further recurrence.  He did have a slight increase in his creatinine early postoperatively which was not unexpected due to his early stage CKD.  He developed atrial fibrillation with RVR on postop day 2.  His blood pressure was in the [id number] systolic.  He was loaded with IV amiodarone.  He was transferred to 4 E. when his heart rate was controlled.  He continued to progress well and was discharged on 06/08/2022.  He followed up with Dr. Kipp Brood on 06/19/2022.  He followed up via telehealth visit.  He reported doing well.  He reported minimal pain.  He was ambulating well.  His vital signs were stable.  Plan for follow-up in 1 month with chest x-ray was made.  He presents to the clinic today for follow-up evaluation and states he feels like he is healing well.  He has been walking inside due to the heat.  We reviewed his open heart surgery and hospitalization.  He and his wife expressed understanding.  His blood pressure is well controlled today  at 130/70.  He denies episodes of irregular heartbeat.  His only complaint today is some left small finger numbness.  We reviewed the importance of sternal precautions and avoiding to drive until after his follow-up chest x-ray.  He expressed understanding.  His EKG today shows normal sinus rhythm 63 bpm.  I will have him continue to maintain her sternal precautions while increasing his physical activity, continue heart healthy low-sodium diet, give lower extremity support  stockings information and plan follow-up in 3 to 4 months.  Today he denies chest pain, shortness of breath, lower extremity edema, fatigue, palpitations, melena, hematuria, hemoptysis, diaphoresis, weakness, presyncope, syncope, orthopnea, and PND.   Home Medications    Prior to Admission medications   Medication Sig Start Date End Date Taking? Authorizing Provider  acetaminophen (TYLENOL) 325 MG tablet Take 2 tablets (650 mg total) by mouth every 4 (four) hours as needed for mild pain or moderate pain. 06/08/22   Leary Roca, PA-C  amiodarone (PACERONE) 200 MG tablet Take 2 tablets (400 mg total) by mouth 2 (two) times daily for 5 days then decrease the dose to 1 tablet (200mg ) TWICE daily for 14 days then decrease the dose to 1 tablet (200mg ) ONCE daily. 06/08/22   , PA-C  amLODipine (NORVASC) 5 MG tablet Take 1 tablet (5 mg total) by mouth daily. 03/26/22 09/22/22  05/26/22, MD  aspirin 81 MG chewable tablet Chew 81 mg by mouth daily. 08/27/21   [provider]  atorvastatin (LIPITOR) 80 MG tablet Take 1 tablet (80 mg total) by mouth daily. 06/08/22   10/27/21, PA-C  Coenzyme Q10 200 MG capsule Take 200 mg by mouth daily.    [provider]  ferrous fumarate-b12-vitamic C-folic acid (TRINSICON / FOLTRIN) capsule Take 1 capsule by mouth 2 (two) times daily after a meal. For 1 month then stop 06/08/22   Roddenberry, Leary Roca, PA-C  gabapentin (NEURONTIN) 300 MG capsule Take 300 mg by mouth 3 (three) times daily. 01/19/22   [provider]  glimepiride (AMARYL) 4 MG tablet Take 4 mg by mouth daily. 05/02/21   [provider]  metFORMIN (GLUCOPHAGE-XR) 750 MG 24 hr tablet Take 750 mg by mouth 2 (two) times daily. 11/14/21   [provider]  metoprolol succinate (TOPROL-XL) 50 MG 24 hr tablet Take 50 mg by mouth daily. 03/20/22   [provider]  nitroGLYCERIN (NITROSTAT) 0.4 MG SL tablet Place 0.4 mg under  the tongue every 5 (five) minutes as needed for chest pain. 02/17/22   [provider]  Omega-3 Fatty Acids (FISH OIL PO) Take 1,500 mg by mouth daily.    [provider]  OZEMPIC, 1 MG/DOSE, 4 MG/3ML SOPN Inject 1 mg into the skin every Thursday. 01/15/22   [provider]    Family History    No family history on file. has no family status information on file.   Social History    Social History   Socioeconomic History   Marital status: Married    Spouse name: Not on file   Number of children: Not on file   Years of education: Not on file   Highest education level: Not on file  Occupational History   Not on file  Tobacco Use   Smoking status: Never   Smokeless tobacco: Never  Vaping Use   Vaping Use: Never used  Substance and Sexual Activity   Alcohol use: Never   Drug use: Never  Sexual activity: Not on file  Other Topics Concern   Not on file  Social History Narrative   Not on file   Social Determinants of Health   Financial Resource Strain: Not on file  Food Insecurity: Not on file  Transportation Needs: Not on file  Physical Activity: Not on file  Stress: Not on file  Social Connections: Not on file  Intimate Partner Violence: Not on file     Review of Systems    General:  No chills, fever, night sweats or weight changes.  Cardiovascular:  No chest pain, dyspnea on exertion, edema, orthopnea, palpitations, paroxysmal nocturnal dyspnea. Dermatological: No rash, lesions/masses Respiratory: No cough, dyspnea Urologic: No hematuria, dysuria Abdominal:   No nausea, vomiting, diarrhea, bright red blood per rectum, melena, or hematemesis Neurologic:  No visual changes, wkns, changes in mental status. All other systems reviewed and are otherwise negative except as noted above.  Physical Exam    VS:  BP 130/70   Pulse 63   Ht 5\' 9"  (1.753 m)   Wt 178 lb 6.4 oz (80.9 kg)   SpO2 98%   BMI 26.35 kg/m  , BMI Body mass index is 26.35  kg/m. GEN: Well nourished, well developed, in no acute distress. HEENT: normal. Neck: Supple, no JVD, carotid bruits, or masses. Cardiac: RRR, no murmurs, rubs, or gallops. No clubbing, cyanosis, edema.  Radials/DP/PT 2+ and equal bilaterally.  Respiratory:  Respirations regular and unlabored, clear to auscultation bilaterally. GI: Soft, nontender, nondistended, BS + x 4. MS: no deformity or atrophy. Skin: warm and dry, no rash.  Sternal surgical incision healing well with no signs of infection. Neuro:  Strength and sensation are intact. Psych: Normal affect.  Accessory Clinical Findings    Recent Labs: 03/27/2022: TSH 0.648 05/29/2022: ALT 18 06/04/2022: Magnesium 2.8 06/08/2022: BUN 30; Creatinine, Ser 1.79; Hemoglobin 10.3; Platelets 264; Potassium 4.6; Sodium 140   Recent Lipid Panel    Component Value Date/Time   CHOL 90 (L) 03/27/2022 1004   TRIG 136 03/27/2022 1004   HDL 33 (L) 03/27/2022 1004   CHOLHDL 2.7 03/27/2022 1004   LDLCALC 33 03/27/2022 1004    ECG personally reviewed by me today-normal sinus rhythm T wave abnormality consider anterior lateral ischemia 63 bpm- No acute changes  Echocardiogram 05/07/2022 IMPRESSIONS     1. Global longitudinal strain is -24.4%. Left ventricular ejection  fraction, by estimation, is 60 to 65%. The left ventricle has normal  function. The left ventricle has no regional wall motion abnormalities.  Left ventricular diastolic parameters were  normal.   2. Right ventricular systolic function is low normal. The right  ventricular size is normal.   3. The mitral valve is normal in structure. Trivial mitral valve  regurgitation.   4. The aortic valve is normal in structure. Aortic valve regurgitation is  not visualized.   Comparison(s): Report only from [facility] 09/25/21.  EF 55-60%.   Cardiac catheterization 05/06/2022   Ost RCA to Prox RCA lesion is 20% stenosed.   RPDA lesion is 75% stenosed.   1st Diag  lesion is 95% stenosed.   Mid LAD lesion is 80% stenosed.   Mid LAD to Dist LAD lesion is 90% stenosed.   Lat 2nd Diag lesion is 90% stenosed.   2nd Diag lesion is 80% stenosed.   1st Mrg lesion is 80% stenosed.   2nd Mrg lesion is 70% stenosed.   3rd Mrg lesion is 90% stenosed.   LPAV lesion  is 95% stenosed.   Mid Cx-1 lesion is 95% stenosed.   Mid Cx-2 lesion is 90% stenosed.   The left ventricular systolic function is normal.   LV end diastolic pressure is normal.   The left ventricular ejection fraction is 50-55% by visual estimate.   Severe multivessel CAD with coronary calcification.   Diffuse calcified LAD stenoses with 95% ostial stenosis of a small first diagonal branch, 80% and 90% proximal to mid and mid diffuse LAD stenoses, with 80 and 90% stenoses of the second diagonal vessel.   Circumflex vessel has stenoses in the marginal branches with segmental 95 and 90% distal stenosis with a large distal posterolateral vessel.   Codominant RCA with mild calcification with 20% proximal stenosis and diffuse proximal 75% PDA stenosis   Low normal global LV contractility with EF estimated 50 to 55%.  There is evidence for subtle mid anterolateral and mid posterior hypocontractility.  LVEDP 11 mmHg   RECOMMENDATION: Recommend surgical consultation for CABG revascularization due to diffuse multivessel calcified CAD  with good distal targets.   Diagnostic Dominance: Right  Intervention   Assessment & Plan   1.  Coronary artery disease status-status post CABG x4 on 06/03/2022.  Continues to slowly increase physical activity and is ambulating multiple times per day.  Surgical site healing well without signs of infection. Continue amlodipine, aspirin, atorvastatin Heart healthy low-sodium diet-salty 6 given Increase physical activity as tolerated [place] support stocking sheet-given  Hyperlipidemia-LDL 33 Continue, aspirin, atorvastatin Heart healthy low-sodium high-fiber  diet Increase physical activity as tolerated  Atrial fibrillation-EKG today shows normal sinus rhythm 63 bpm.  He was noted to have an episode of SVT and postoperative atrial fibrillation on day 2.  He was treated with IV amiodarone and transition to p.o. amiodarone. Continue metoprolol, amiodarone Heart healthy low-sodium diet-salty 6 given Increase physical activity as tolerated  Type 2 diabetes-glucose 131 on 06/08/2022. Continue medical therapy Heart healthy low-sodium carb modified diet Follows with PCP  Disposition: Follow-up with Dr. Cliffton Asters as scheduled and Dr. Tresa Endo in 3-4 months.   Thomasene Ripple. Arie Powell NP-C     06/24/2022, 12:02 PM [facility] [address] Office [phone] Fax [phone]  Notice: This dictation was prepared with Dragon dictation along with smaller phrase technology. Any transcriptional errors that result from this process are unintentional and may not be corrected upon review.  I spent 14 minutes examining this patient, reviewing medications, and using patient centered shared decision making involving her cardiac care.  Prior to her visit I spent greater than 20 minutes reviewing her past medical history,  medications, and prior cardiac tests.

## 2022-06-24 ENCOUNTER — Ambulatory Visit (INDEPENDENT_AMBULATORY_CARE_PROVIDER_SITE_OTHER): Payer: Medicare Other | Admitting: General Practice

## 2022-06-24 ENCOUNTER — Encounter: Payer: Self-pay | Admitting: General Practice

## 2022-06-24 VITALS — BP 130/70 | HR 63 | Ht 69.0 in | Wt 178.4 lb

## 2022-06-24 DIAGNOSIS — E785 Hyperlipidemia, unspecified: Secondary | ICD-10-CM | POA: Diagnosis not present

## 2022-06-24 DIAGNOSIS — I4891 Unspecified atrial fibrillation: Secondary | ICD-10-CM

## 2022-06-24 DIAGNOSIS — E119 Type 2 diabetes mellitus without complications: Secondary | ICD-10-CM | POA: Diagnosis not present

## 2022-06-24 DIAGNOSIS — I2510 Atherosclerotic heart disease of native coronary artery without angina pectoris: Secondary | ICD-10-CM

## 2022-06-24 NOTE — Patient Instructions (Signed)
Medication Instructions:  The current medical regimen is effective;  continue present plan and medications as directed. Please refer to the Current Medication list given to you today.   *If you need a refill on your cardiac medications before your next appointment, please call your pharmacy*  Lab Work:   Testing/Procedures:  NONE    NONE If you have labs (blood work) drawn today and your tests are completely normal, you will receive your results only by: MyChart Message (if you have MyChart) OR  A paper copy in the mail If you have any lab test that is abnormal or we need to change your treatment, we will call you to review the results.  Special Instructions MAINTAIN STERNAL PRECAUTIONS  PLEASE PURCHASE AND WEAR COMPRESSION STOCKINGS DAILY AND TAKE OFF AT BEDTIME. Compression stockings are elastic socks that squeeze the legs. They help to increase blood flow to the legs and to decrease swelling in the legs from fluid retention, and reduce the chance of developing blood clots in the lower legs. Please put on in the AM when dressing and off at night when dressing for bed.  LET THEM KNOW THAT YOU NEED KNEE HIGH'S WITH COMPRESSION OF 15-20 mmhg.  [employer]  [address] ([phone]); [place], [place] [address]; [phone]  EMAIL   [email or web address].  PLEASE MAKE SURE TO ELEVATE YOUR FEET & LEGS WHILE SITTING, THIS WILL HELP WITH THE SWELLING ALSO.   Follow-Up: Your next appointment:  10-26-2022  at 1040AM  In Person with [name], MD    At [facility], you and your health needs are our priority.  As part of our continuing mission to provide you with exceptional heart care, we have created designated Provider Care Teams.  These Care Teams include your primary Cardiologist (physician) and Advanced Practice Providers (APPs -  Physician Assistants and Nurse Practitioners) who all work together to provide you with the care you need, when you need it.  Important  Information About Sugar

## 2022-07-01 ENCOUNTER — Other Ambulatory Visit (HOSPITAL_COMMUNITY): Payer: Self-pay

## 2022-07-01 MED ORDER — ATORVASTATIN CALCIUM 80 MG PO TABS: 80.0000 mg | ORAL_TABLET | Freq: Every day | ORAL | 3 refills | Status: DC

## 2022-07-09 ENCOUNTER — Other Ambulatory Visit: Payer: Self-pay

## 2022-07-09 MED ORDER — AMIODARONE HCL 200 MG PO TABS: 400.0000 mg | ORAL_TABLET | Freq: Two times a day (BID) | ORAL | 6 refills | Status: DC

## 2022-07-11 ENCOUNTER — Encounter: Payer: Self-pay | Admitting: Cardiovascular Disease

## 2022-07-11 DIAGNOSIS — Z79899 Other long term (current) drug therapy: Secondary | ICD-10-CM

## 2022-07-13 ENCOUNTER — Telehealth: Payer: Self-pay | Admitting: General Practice

## 2022-07-13 NOTE — Telephone Encounter (Signed)
  Thank you for your message seeking medical advice.* My assessment and recommendation are as follows:  Case was discussed with Dr. Wyline Mood.  Please contact Dustin Goodwin and let him know that he may discontinue his amiodarone.  Please have him continue to monitor his pulse and watch for palpitations.  We will have him continue all of his other medications at this time.  Sincerely,  Ronney Asters, NP    *This exchange required the expertise of a doctor, nurse practitioner, physician assistant, optometrist or certified nurse midwife and qualifies as a Medical Advice Message, please visit [email or web address] for more details. [facility] will bill your insurance on your behalf; copays and deductibles may apply. Questions? Reply to this message.

## 2022-07-13 NOTE — Telephone Encounter (Signed)
See my chart message

## 2022-07-13 NOTE — Telephone Encounter (Signed)
Pt c/o medication issue:  1. Name of Medication:   amiodarone (PACERONE) 200 MG tablet    2. How are you currently taking this medication (dosage and times per day)? Take 2 tablets (400 mg total) by mouth 2 (two) times daily  3. Are you having a reaction (difficulty breathing--STAT)?   4. What is your medication issue? Pt wife called stating pt has been throwing up constantly with this medication. She states they think it is because of this medication

## 2022-07-16 ENCOUNTER — Other Ambulatory Visit: Payer: Self-pay | Admitting: Thoracic Surgery (Cardiothoracic Vascular Surgery)

## 2022-07-16 DIAGNOSIS — Z951 Presence of aortocoronary bypass graft: Secondary | ICD-10-CM

## 2022-07-21 NOTE — Progress Notes (Unsigned)
      [address]       [place] [address]             [phone]       HPI: This is a 71 year old male with a past medical history of hypertension, hyperlipidemia, and diabetes mellitus who was found to have coronary artery disease. He  underwent a CABG x 4 (LIMA to LAD, RSVG sequentially to OM1 and OM3, and RSVG to PDA) by Dr. Cliffton Asters on 06/03/2022.He presents today for routine follow up after surgery.The patient's early postoperative recovery while in the hospital was notable for atrial fibrillation with RVR. Since hospital discharge the patient reports   Current Outpatient Medications  Medication Sig Dispense Refill   acetaminophen (TYLENOL) 325 MG tablet Take 2 tablets (650 mg total) by mouth every 4 (four) hours as needed for mild pain or moderate pain. (Patient not taking: Reported on 06/24/2022)     amiodarone (PACERONE) 200 MG tablet Take 2 tablets (400 mg total) by mouth 2 (two) times daily. 60 tablet 6   amLODipine (NORVASC) 5 MG tablet Take 1 tablet (5 mg total) by mouth daily. 90 tablet 1   aspirin 81 MG chewable tablet Chew 81 mg by mouth daily.     atorvastatin (LIPITOR) 80 MG tablet Take 1 tablet (80 mg total) by mouth daily. 90 tablet 3   Coenzyme Q10 200 MG capsule Take 200 mg by mouth daily.     ferrous fumarate-b12-vitamic C-folic acid (TRINSICON / FOLTRIN) capsule Take 1 capsule by mouth 2 (two) times daily after a meal. For 1 month then stop 60 capsule 0   gabapentin (NEURONTIN) 300 MG capsule Take 300 mg by mouth 3 (three) times daily.     glimepiride (AMARYL) 4 MG tablet Take 4 mg by mouth daily.     metFORMIN (GLUCOPHAGE-XR) 750 MG 24 hr tablet Take 750 mg by mouth 2 (two) times daily.     metoprolol succinate (TOPROL-XL) 50 MG 24 hr tablet Take 50 mg by mouth daily.     nitroGLYCERIN (NITROSTAT) 0.4 MG SL tablet Place 0.4 mg under the tongue every 5 (five) minutes as needed for chest pain.     Omega-3 Fatty Acids (FISH OIL PO) Take 1,500 mg  by mouth daily.     OZEMPIC, 1 MG/DOSE, 4 MG/3ML SOPN Inject 1 mg into the skin every Thursday.    Vital Signs:   Physical Exam: CV- Pulmonary- Abdomen- Extremities- Wounds-  Diagnostic Tests:   Impression and Plan: He was seen by Ree Edman NP for cardiology on 07/13/2022. EKG at that time showed normal sinus rhythm. The patient later contacted the cardiology office because of nausea and vomiting. Amiodarone was then stopped.  Patient was instructed to continue with sternal precautions (I.e. no lifting more than 10 pounds) for the next ** weeks.  As long as the patient is NOT taking any narcotic for pain, he/she may begin driving short distances (I.e. 30 minutes  or less during the day) and may gradually increase the frequency and duration as tolerates. We encourage the  Patient to participate in cardiac rehab;he/she does/does not wish to do so.     Ardelle Balls, PA-C [facility] [phone]

## 2022-07-23 ENCOUNTER — Ambulatory Visit (INDEPENDENT_AMBULATORY_CARE_PROVIDER_SITE_OTHER): Payer: Self-pay | Admitting: Physician Assistant

## 2022-07-23 ENCOUNTER — Ambulatory Visit
Admission: RE | Admit: 2022-07-23 | Discharge: 2022-07-23 | Disposition: A | Discharge status: Home / Self Care | Payer: Medicare Other | Source: Ambulatory Visit | Attending: Thoracic Surgery (Cardiothoracic Vascular Surgery) | Admitting: Thoracic Surgery (Cardiothoracic Vascular Surgery)

## 2022-07-23 VITALS — BP 111/71 | HR 75 | Resp 20 | Ht 69.0 in | Wt 174.0 lb

## 2022-07-23 DIAGNOSIS — I2510 Atherosclerotic heart disease of native coronary artery without angina pectoris: Secondary | ICD-10-CM

## 2022-07-23 DIAGNOSIS — Z951 Presence of aortocoronary bypass graft: Secondary | ICD-10-CM

## 2022-07-23 NOTE — Patient Instructions (Signed)
You are encouraged to enroll and participate in the outpatient cardiac rehab program beginning as soon as practical.  You may return to driving an automobile as long as you are no longer requiring oral narcotic pain relievers during the daytime.  It would be wise to start driving only short distances during the daylight and gradually increase from there as you feel comfortable.  Continue to avoid any heavy lifting or strenuous use of your arms or shoulders for at least a total of 3 months from the time of surgery. After 3 months, you may gradually increase how much you lift or use your chest or arms as tolerated. With limits based upon whether or not activities lead to the return of significant discomfort. 

## 2022-07-23 NOTE — Progress Notes (Signed)
[address]       [place] [address]             [phone]       HPI:  [name] is a 71 year old male with a past medical history of hypertension, hyperlipidemia, and diabetes mellitus who was found to have coronary artery disease. He underwent a CABG x 4 (LIMA to LAD, RSVG sequentially to OM1 and OM3, and RSVG to PDA) by Dr. Cliffton Asters on 06/03/2022.He presents today for routine follow up after surgery.The patient's early postoperative recovery while in the hospital was notable for atrial fibrillation with RVR. He has seen cardiology who recently discontinued his amiodarone due to nausea and vomiting. An EKG was performed and he was in sinus rhythm. He has not had any problems since it was discontinued and feels he is still in sinus rhythm. Since hospital discharge the patient reports he has been feeling well. He takes chronic pain medication but has not had any chest pain or shortness of breath. He complains of some swelling in his LLE but no pain or erythema. His CXR today shows no pleural effusion.   Current Outpatient Medications  Medication Sig Dispense Refill   acetaminophen (TYLENOL) 325 MG tablet Take 2 tablets (650 mg total) by mouth every 4 (four) hours as needed for mild pain or moderate pain. (Patient not taking: Reported on 06/24/2022)     amiodarone (PACERONE) 200 MG tablet Take 2 tablets (400 mg total) by mouth 2 (two) times daily. 60 tablet 6   amLODipine (NORVASC) 5 MG tablet Take 1 tablet (5 mg total) by mouth daily. 90 tablet 1   aspirin 81 MG chewable tablet Chew 81 mg by mouth daily.     atorvastatin (LIPITOR) 80 MG tablet Take 1 tablet (80 mg total) by mouth daily. 90 tablet 3   Coenzyme Q10 200 MG capsule Take 200 mg by mouth daily.     ferrous fumarate-b12-vitamic C-folic acid (TRINSICON / FOLTRIN) capsule Take 1 capsule by mouth 2 (two) times daily after a meal. For 1 month then stop 60 capsule 0   gabapentin (NEURONTIN) 300 MG capsule Take  300 mg by mouth 3 (three) times daily.     glimepiride (AMARYL) 4 MG tablet Take 4 mg by mouth daily.     metFORMIN (GLUCOPHAGE-XR) 750 MG 24 hr tablet Take 750 mg by mouth 2 (two) times daily.     metoprolol succinate (TOPROL-XL) 50 MG 24 hr tablet Take 50 mg by mouth daily.     nitroGLYCERIN (NITROSTAT) 0.4 MG SL tablet Place 0.4 mg under the tongue every 5 (five) minutes as needed for chest pain.     Omega-3 Fatty Acids (FISH OIL PO) Take 1,500 mg by mouth daily.     OZEMPIC, 1 MG/DOSE, 4 MG/3ML SOPN Inject 1 mg into the skin every Thursday.    Vital Signs: Vitals:   07/23/22 1244  BP: 111/71  Pulse: 75  Resp: 20  SpO2: 95%   Physical Exam: CV-NSR, no murmur Pulmonary-Clear to auscultation bilaterally Abdomen-Nontender, active bowel sounds Extremities-Edematous LLE, no erythema, neurovascularly intact Wounds-Clean, dry, intact  Diagnostic Tests:  CLINICAL DATA:  s/p cabg x 4   EXAM: CHEST - 2 VIEW   COMPARISON:  06/05/2022   FINDINGS: Cardiomediastinal silhouette and pulmonary vasculature are within normal limits.   Postsurgical changes of CABG again seen.   Lungs are clear.   Advanced bilateral glenohumeral osteoarthrosis.   IMPRESSION: No acute  cardiopulmonary process.     Electronically Signed   By: Acquanetta Belling M.D.   On: 07/23/2022 12:43  Impression and Plan:  [name] is a 71 year old male who presents for follow up after a CABG x 4 on 06/03/22 with Dr. Cliffton Asters. He is doing well and his only complaint is LLE swelling, which I told him to elevate and wear compression stockings as tolerated. He reports he was seen by Ree Edman NP for cardiology follow up on 07/13/2022. EKG at that time showed normal sinus rhythm. The patient later contacted the cardiology office because of nausea and vomiting. Amiodarone was then stopped. I instructed him to continue with sternal precautions (I.e. no lifting more than 10 pounds) for the next weeks. As long as the  patient is NOT taking any narcotic for pain, he may begin driving short distances (I.e. 30 minutes or less during the day) and may gradually increase the frequency and duration as tolerates. I encouraged the patient to participate in cardiac rehab and he does wish to do so. His CXR today showed no pleural effusion or acute cardiopulmonary processes so we will plan to see the patient as needed.    Dustin Reichmann, PA-C [facility] [phone]

## 2022-07-29 ENCOUNTER — Telehealth: Payer: Self-pay | Admitting: Cardiovascular Disease

## 2022-07-29 NOTE — Telephone Encounter (Signed)
Dustin Goodwin from [facility] called stating she will be faxing over an order for cardiac rehab to [phone] and wanted nurse to be aware.

## 2022-07-31 NOTE — Telephone Encounter (Signed)
Information scanned into chart - sent over to HIM pool today.

## 2022-08-13 ENCOUNTER — Encounter: Payer: Self-pay | Admitting: Cardiovascular Disease

## 2022-09-15 ENCOUNTER — Other Ambulatory Visit: Payer: Self-pay | Admitting: Cardiovascular Disease

## 2022-09-15 DIAGNOSIS — R072 Precordial pain: Secondary | ICD-10-CM

## 2022-10-26 ENCOUNTER — Ambulatory Visit: Payer: Medicare Other | Admitting: Cardiovascular Disease

## 2022-12-03 ENCOUNTER — Encounter: Payer: Self-pay | Admitting: Cardiovascular Disease

## 2022-12-03 ENCOUNTER — Ambulatory Visit: Payer: Medicare Other | Attending: Cardiovascular Disease | Admitting: Cardiovascular Disease

## 2022-12-03 DIAGNOSIS — I4891 Unspecified atrial fibrillation: Secondary | ICD-10-CM | POA: Insufficient documentation

## 2022-12-03 DIAGNOSIS — R072 Precordial pain: Secondary | ICD-10-CM

## 2022-12-03 DIAGNOSIS — I2510 Atherosclerotic heart disease of native coronary artery without angina pectoris: Secondary | ICD-10-CM | POA: Insufficient documentation

## 2022-12-03 DIAGNOSIS — E7800 Pure hypercholesterolemia, unspecified: Secondary | ICD-10-CM

## 2022-12-03 DIAGNOSIS — I9789 Other postprocedural complications and disorders of the circulatory system, not elsewhere classified: Secondary | ICD-10-CM | POA: Insufficient documentation

## 2022-12-03 DIAGNOSIS — N184 Chronic kidney disease, stage 4 (severe): Secondary | ICD-10-CM | POA: Insufficient documentation

## 2022-12-03 DIAGNOSIS — Z951 Presence of aortocoronary bypass graft: Secondary | ICD-10-CM | POA: Diagnosis not present

## 2022-12-03 DIAGNOSIS — E7841 Elevated Lipoprotein(a): Secondary | ICD-10-CM | POA: Insufficient documentation

## 2022-12-03 DIAGNOSIS — E119 Type 2 diabetes mellitus without complications: Secondary | ICD-10-CM | POA: Insufficient documentation

## 2022-12-03 DIAGNOSIS — I10 Essential (primary) hypertension: Secondary | ICD-10-CM | POA: Insufficient documentation

## 2022-12-03 DIAGNOSIS — E785 Hyperlipidemia, unspecified: Secondary | ICD-10-CM | POA: Insufficient documentation

## 2022-12-03 DIAGNOSIS — Z79899 Other long term (current) drug therapy: Secondary | ICD-10-CM | POA: Insufficient documentation

## 2022-12-03 DIAGNOSIS — Z794 Long term (current) use of insulin: Secondary | ICD-10-CM | POA: Insufficient documentation

## 2022-12-03 MED ORDER — AMLODIPINE BESYLATE 2.5 MG PO TABS: 2.5000 mg | ORAL_TABLET | Freq: Every day | ORAL | 3 refills | Status: DC

## 2022-12-03 MED ORDER — AMIODARONE HCL 200 MG PO TABS: 100.0000 mg | ORAL_TABLET | Freq: Every day | ORAL | 3 refills | Status: DC

## 2022-12-03 NOTE — Patient Instructions (Addendum)
Medication Instructions:  Your physician has recommended you make the following change in your medication:  DECREASE: Amiodarone 100mg  for the next 2 months then take the amiodarone every other day   DECREASE: Amlodipine 2.5mg  daily  *If you need a refill on your cardiac medications before your next appointment, please call your pharmacy*   Lab Work: Your physician recommends that you return at your earliest convenience to have the following labs drawn: CMET, CBC, TSH and Lipids.  If you have labs (blood work) drawn today and your tests are completely normal, you will receive your results only by: [place] (if you have MyChart) OR A paper copy in the mail If you have any lab test that is abnormal or we need to change your treatment, we will call you to review the results.   Testing/Procedures: Your physician has requested that you have an echocardiogram in 3 months. Echocardiography is a painless test that uses sound waves to create images of your heart. It provides your doctor with information about the size and shape of your heart and how well your heart's chambers and valves are working. This procedure takes approximately one hour. There are no restrictions for this procedure. Please do NOT wear cologne, perfume, aftershave, or lotions (deodorant is allowed). Please arrive 15 minutes prior to your appointment time.    Follow-Up: At [facility], you and your health needs are our priority.  As part of our continuing mission to provide you with exceptional heart care, we have created designated Provider Care Teams.  These Care Teams include your primary Cardiologist (physician) and Advanced Practice Providers (APPs -  Physician Assistants and Nurse Practitioners) who all work together to provide you with the care you need, when you need it.  We recommend signing up for the patient portal called "MyChart".  Sign up information is provided on this After Visit Summary.  MyChart  is used to connect with patients for Virtual Visits (Telemedicine).  Patients are able to view lab/test results, encounter notes, upcoming appointments, etc.  Non-urgent messages can be sent to your provider as well.   To learn more about what you can do with MyChart, go to [email or web address].    Your next appointment:   3-4 month(s)  Provider:   Shelva Majestic, MD

## 2022-12-03 NOTE — Progress Notes (Signed)
Cardiology Office Note    Date:  12/04/2022   ID:  Laurice Kimmons, DOB [birth year], MRN [id number]  PCP:  Olena Mater, MD  Cardiologist:  Shelva Majestic, MD   7 month F/U evaluation  History of Present Illness:  Arney Mayabb is a 72 y.o. male who is the husband of my patient Jenny Reichmann  Izora Gala) [place].  He lives in [place], [place].  He has retired and works on his farm cutting wood and mowing grass.  He has a history of hypertension as well as a history of diabetes mellitus for greater than 10 years.  Approximately 1 year ago he had experienced some chest pain associated with shortness of breath and diaphoresis.  He has been followed at [facility] clinic in [place].  He subsequently had another episode which was relieved with nitroglycerin.  On August 27, 2021 he underwent a [place] study which raised the possibility of a small fixed defect inferiorly.  EF was 57% and there were no ECG changes.  He had an echo Doppler study on September 25, 2021 which showed an EF of 55 to 60% and he had normal wall motion.  Recently, he has experienced some recurrent episodes of chest discomfort and typically there may be 1 to 2 months without experiencing symptomatology.  His last episode occurred 1 week ago which was associated with chest pressure.  In the interim, he remains active and denies any exertional precipitation of discomfort.  Presently, he is on metoprolol succinate 50 mg daily, aspirin 81 mg milligrams, and has been on simvastatin 10 mg daily for hyperlipidemia.  He is on glimepiride, metformin and Ozempic for diabetes.  He has a history of neuropathy on gabapentin.  With his recurrent chest pains symptomatology he wished to be seen by me and I saw him for my initial evaluation on Mar 26, 2022.  At his initial evaluation, his blood pressure was elevated.  He had been on metoprolol succinate 50 mg daily and his ventricular rate was in the 60s.  I recommended follow-up  laboratory with a fasting lipid panel, TSH and LP(a).  I started him on amlodipine 5 mg both for blood pressure control and potential anti-ischemic benefit.  I recommended he undergo coronary CTA for further evaluation of his chest pain, and evaluate for coronary calcification and potential luminal stenosis.  I last saw him on April 27, 2022.  At that time he denied any recurrent chest pain since instituting amlodipine and his blood pressure has improved.  He underwent coronary CTA on Apr 13, 2022 which revealed an elevated calcium score at 1283, representing 10 percentile for age, sex and race matched controls.  He was felt to have severe stenosis in the proximal and mid LAD in the range of 70 to 99% and severe stenosis in the distal circumflex and a range of 70 and 99% and OM 2 vessel.  There was mild 25 to 49% stenosis in the mid RCA.  Subsequent FFR analysis was performed which is positive for hemodynamic significance in the LAD at 0.56, distal LAD less than 0.50, the distal circumflex was felt possibly to be occluded.  OM 2 vessel was 0.71.    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  Due to his diffuse multivessel calcified disease CABG revascularization was recommended.    He underwent CABG x 4 on June 03, 2022 by Dr. Kipp Brood and had a LIMA placed to his LAD,  SVG to the first and third marginal and PDA.  He developed a brief episode of SVT in the early morning postop day 1 treated with IV metoprolol.  He developed AF with RVR on postop day 2.  He was loaded with IV amniodarone.  He did have slight increase in serum creatinine.  He was subsequently evaluated by Edd Fabian, NP on June 24, 2022 and felt well.  I have not seen Mr. Verdis Frederickson since his cardiac catheterization.  He has just completed cardiac rehab in [place] following 8 weeks.  He feels significantly stronger.  He denies chest pain.  He is unaware of  recurrent atrial fibrillation.  He has continued to be on reduced dose of amiodarone at 200 mg daily.  He is on atorvastatin 80 mg.  LP(a) was increased at 147.  His blood pressure had gotten low and he now is on reduced dose of amlodipine at 2.5 mg in addition to his metoprolol succinate 50 mg.  He is diabetic on glimepiride and metformin.  He has been taking over-the-counter fish oil.  He presents for follow-up evaluation.  Past medical history is notable for hypertension, type 2 diabetes mellitus, peripheral neuropathy and hyperlipidemia.  Surgical history is notable for leg and back surgery following a severe fall where he had fallen 50 feet resulting in significant injury.  He also status post hernia surgery.  Current Medications: Outpatient Medications Prior to Visit  Medication Sig Dispense Refill   acetaminophen (TYLENOL) 325 MG tablet Take 2 tablets (650 mg total) by mouth every 4 (four) hours as needed for mild pain or moderate pain.     aspirin 81 MG chewable tablet Chew 81 mg by mouth daily.     atorvastatin (LIPITOR) 80 MG tablet Take 1 tablet (80 mg total) by mouth daily. 90 tablet 3   Coenzyme Q10 200 MG capsule Take 200 mg by mouth daily.     ferrous fumarate-b12-vitamic C-folic acid (TRINSICON / FOLTRIN) capsule Take 1 capsule by mouth 2 (two) times daily after a meal. For 1 month then stop 60 capsule 0   gabapentin (NEURONTIN) 300 MG capsule Take 300 mg by mouth 3 (three) times daily.     glimepiride (AMARYL) 4 MG tablet Take 2 mg by mouth daily.     metFORMIN (GLUCOPHAGE-XR) 750 MG 24 hr tablet Take 750 mg by mouth 2 (two) times daily.     metoprolol succinate (TOPROL-XL) 50 MG 24 hr tablet Take 50 mg by mouth daily.     nitroGLYCERIN (NITROSTAT) 0.4 MG SL tablet Place 0.4 mg under the tongue every 5 (five) minutes as needed for chest pain.     Omega-3 Fatty Acids (FISH OIL PO) Take 1,500 mg by mouth daily.     amiodarone (PACERONE) 200 MG tablet Take 2 tablets (400 mg total)  by mouth 2 (two) times daily. 60 tablet 6   amLODipine (NORVASC) 5 MG tablet TAKE 1 TABLET (5 MG TOTAL) BY MOUTH DAILY. 90 tablet 1   OZEMPIC, 1 MG/DOSE, 4 MG/3ML SOPN Inject 1 mg into the skin every Thursday.     No facility-administered medications prior to visit.     Allergies:   Patient has no known allergies.   Social History   Socioeconomic History   Marital status: Married    Spouse name: Not on file   Number of children: Not on file   Years of education: Not on file   Highest education level: Not on file  Occupational History   Not  on file  Tobacco Use   Smoking status: Never   Smokeless tobacco: Never  Vaping Use   Vaping Use: Never used  Substance and Sexual Activity   Alcohol use: Never   Drug use: Never   Sexual activity: Not on file  Other Topics Concern   Not on file  Social History Narrative   Not on file   Social Determinants of Health   Financial Resource Strain: Not on file  Food Insecurity: Not on file  Transportation Needs: Not on file  Physical Activity: Not on file  Stress: Not on file  Social Connections: Not on file    Social history is notable that he was born in [place].  He has been married for 46 years.  He retired at age 43 from [employer] which [employer].  There is no tobacco alcohol history.  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  He has 2 living brothers ages 28 and 80 both with diabetes.  He has a sister age 32.  ROS General: Negative; No fevers, chills, or night sweats;  HEENT: Negative; No changes in vision or hearing, sinus congestion, difficulty swallowing Pulmonary: Negative; No cough, wheezing, shortness of breath, hemoptysis Cardiovascular: See HPI GI: Negative; No nausea, vomiting, diarrhea, or abdominal pain GU: Negative; No dysuria, hematuria, or difficulty  voiding Musculoskeletal: Remote fall leading to back and leg issues Hematologic/Oncology: Negative; no easy bruising, bleeding Endocrine: Negative; no heat/cold intolerance; no diabetes Neuro: Negative; no changes in balance, headaches Skin: Negative; No rashes or skin lesions Psychiatric: Negative; No behavioral problems, depression Sleep: Negative; No snoring, daytime sleepiness, hypersomnolence, bruxism, restless legs, hypnogognic hallucinations, no cataplexy Other comprehensive 14 point system review is negative.   PHYSICAL EXAM:   VS:  BP (!) 114/56 (BP Location: Left Arm, Patient Position: Sitting, Cuff Size: Normal)   Pulse 68   Ht 5\' 8"  (1.727 m)   Wt 187 lb 9.6 oz (85.1 kg)   SpO2 96%   BMI 28.52 kg/m     Repeat blood pressure by me was 120/62  Wt Readings from Last 3 Encounters:  12/03/22 187 lb 9.6 oz (85.1 kg)  07/23/22 174 lb (78.9 kg)  06/24/22 178 lb 6.4 oz (80.9 kg)   General: Alert, oriented, no distress.  Skin: normal turgor, no rashes, warm and dry HEENT: Normocephalic, atraumatic. Pupils equal round and reactive to light; sclera anicteric; extraocular muscles intact; 5:20 AM 520 Nose without nasal septal hypertrophy Mouth/Parynx benign; Mallinpatti scale 3 Neck: No JVD, no carotid bruits; normal carotid upstroke Lungs: clear to ausculatation and percussion; no wheezing or rales Chest wall: without tenderness to palpitation Heart: PMI not displaced, RRR, s1 s2 normal, 1/6 systolic murmur, no diastolic murmur, no rubs, gallops, thrills, or heaves Abdomen: soft, nontender; no hepatosplenomehaly, BS+; abdominal aorta nontender and not dilated by palpation. Back: no CVA tenderness Pulses 2+ Musculoskeletal: full range of motion, normal strength, no joint deformities Extremities: no clubbing cyanosis or edema, Homan's sign negative  Neurologic: grossly nonfocal; Cranial nerves grossly wnl Psychologic: Normal mood and affect    Studies/Labs Reviewed:    December 03, 2022 ECG (independently read by me): NSR at 66, no ST changes, no ectopy  April 27, 2022 ECG (independently read by me): Sinus bradycardia at 59, no ST changes  Mar 26, 2022 ECG (independently read by me):  NSR at 68, no STT  changes, no ectopy, normal intervals  Recent Labs: He brought with him recent laboratory from Dr. Christena Flake from March 20, 2022. Hemoglobin 14.9/hematocrit 43.4/platelets 229,000 Potassium 4.1, creatinine 1.32.  Glucose 155, AST 26, ALT 24.     Latest Ref Rng & Units 12/04/2022    9:36 AM 06/08/2022    2:56 AM 06/06/2022   12:33 AM  BMP  Glucose 70 - 99 mg/dL 78  789  87   BUN 8 - 27 mg/dL 45  30  24   Creatinine 0.76 - 1.27 mg/dL 3.81  0.17  5.10   BUN/Creat Ratio 10 - 24 18     Sodium 134 - 144 mmol/L 145  140  137   Potassium 3.5 - 5.2 mmol/L 4.8  4.6  4.1   Chloride 96 - 106 mmol/L 107  105  104   CO2 20 - 29 mmol/L 24  27  26    Calcium 8.6 - 10.2 mg/dL 9.0  8.8  8.4         Latest Ref Rng & Units 12/04/2022    9:36 AM 05/29/2022   11:07 AM  Hepatic Function  Total Protein 6.0 - 8.5 g/dL 6.5  6.8   Albumin 3.8 - 4.8 g/dL 4.6  4.0   AST 0 - 40 IU/L 16  19   ALT 0 - 44 IU/L 15  18   Alk Phosphatase 44 - 121 IU/L 89  60   Total Bilirubin 0.0 - 1.2 mg/dL 0.4  1.0        07/30/2022    9:36 AM 06/08/2022    2:56 AM 06/06/2022   12:33 AM  CBC  WBC WILL FOLLOW  P 7.0  9.7   Hemoglobin WILL FOLLOW  P 10.3  10.1   Hematocrit WILL FOLLOW  P 30.1  28.9   Platelets WILL FOLLOW  P 264  191     P Preliminary result   Lab Results  Component Value Date   MCV WILL FOLLOW 12/04/2022   MCV 96.5 06/08/2022   MCV 96.0 06/06/2022   Lab Results  Component Value Date   TSH 1.480 12/04/2022   Lab Results  Component Value Date   HGBA1C 6.6 (H) 05/29/2022     BNP No results found for: "BNP"  ProBNP No results found for: "PROBNP"   Lipid Panel     Component Value Date/Time   CHOL 78 (L) 12/04/2022 0936   TRIG 73 12/04/2022 0936    HDL 29 (L) 12/04/2022 0936   CHOLHDL 2.7 12/04/2022 0936   LDLCALC 33 12/04/2022 0936   LABVLDL 16 12/04/2022 0936     RADIOLOGY: ECHOCARDIOGRAM COMPLETE  Result Date: 12/04/2022    ECHOCARDIOGRAM REPORT   Patient Name:   RUDI BUNYARD Date of Exam: 12/04/2022 Medical Rec #:  02/02/2023          Height:       68.0 in Accession #:    [id number]         Weight:       187.6 lb Date of Birth:  1951-08-20         BSA:          1.989 m Patient Age:    71 years           BP:           114/56 mmHg Patient Gender: M                  HR:  60 bpm. Exam Location:  [address] Procedure: 2D Echo, Color Doppler, Cardiac Doppler and 3D Echo Indications:    CAD I25.10  History:        Patient has prior history of Echocardiogram examinations, most                 recent 05/07/2022. CAD, Prior CABG; Risk Factors:Hypertension,                 Diabetes and Dyslipidemia.  Sonographer:    Thurman Coyer RDCS Referring Phys: [phone] Tonjia Parillo A Hersel Mcmeen IMPRESSIONS  1. Left ventricular ejection fraction, by estimation, is 60 to 65%. The left ventricle has normal function. The left ventricle has no regional wall motion abnormalities. Left ventricular diastolic parameters are indeterminate.  2. Right ventricular systolic function is mildly reduced. The right ventricular size is mildly enlarged. There is normal pulmonary artery systolic pressure.  3. Left atrial size was mild to moderately dilated.  4. The mitral valve is grossly normal. Trivial mitral valve regurgitation. No evidence of mitral stenosis.  5. The aortic valve is tricuspid. There is mild calcification of the aortic valve. Aortic valve regurgitation is trivial. Aortic valve sclerosis is present, with no evidence of aortic valve stenosis.  6. The inferior vena cava is normal in size with greater than 50% respiratory variability, suggesting right atrial pressure of 3 mmHg. Comparison(s): No significant change from prior study. Conclusion(s)/Recommendation(s):  Otherwise normal echocardiogram, with minor abnormalities described in the report. FINDINGS  Left Ventricle: Left ventricular ejection fraction, by estimation, is 60 to 65%. The left ventricle has normal function. The left ventricle has no regional wall motion abnormalities. The left ventricular internal cavity size was normal in size. There is  no left ventricular hypertrophy. Left ventricular diastolic parameters are indeterminate. Right Ventricle: The right ventricular size is mildly enlarged. Right vetricular wall thickness was not well visualized. Right ventricular systolic function is mildly reduced. There is normal pulmonary artery systolic pressure. The tricuspid regurgitant velocity is 2.50 m/s, and with an assumed right atrial pressure of 3 mmHg, the estimated right ventricular systolic pressure is 28.0 mmHg. Left Atrium: Left atrial size was mild to moderately dilated. Right Atrium: Right atrial size was normal in size. Pericardium: There is no evidence of pericardial effusion. Mitral Valve: The mitral valve is grossly normal. Trivial mitral valve regurgitation. No evidence of mitral valve stenosis. Tricuspid Valve: The tricuspid valve is normal in structure. Tricuspid valve regurgitation is trivial. No evidence of tricuspid stenosis. Aortic Valve: The aortic valve is tricuspid. There is mild calcification of the aortic valve. Aortic valve regurgitation is trivial. Aortic valve sclerosis is present, with no evidence of aortic valve stenosis. Pulmonic Valve: The pulmonic valve was not well visualized. Pulmonic valve regurgitation is mild. No evidence of pulmonic stenosis. Aorta: The aortic root, ascending aorta and aortic arch are all structurally normal, with no evidence of dilitation or obstruction. Venous: The inferior vena cava is normal in size with greater than 50% respiratory variability, suggesting right atrial pressure of 3 mmHg. IAS/Shunts: The atrial septum is grossly normal.  LEFT VENTRICLE  PLAX 2D LVIDd:         4.90 cm   Diastology LVIDs:         3.00 cm   LV e' medial:    7.07 cm/s LV PW:         1.00 cm   LV E/e' medial:  12.7 LV IVS:        1.00 cm  LV e' lateral:   12.90 cm/s LVOT diam:     2.10 cm   LV E/e' lateral: 6.9 LV SV:         77 LV SV Index:   38 LVOT Area:     3.46 cm                           3D Volume EF:                          3D EF:        58 %                          LV EDV:       182 ml                          LV ESV:       76 ml                          LV SV:        106 ml RIGHT VENTRICLE RV Basal diam:  4.50 cm RV Mid diam:    4.20 cm RV S prime:     9.43 cm/s TAPSE (M-mode): 1.1 cm LEFT ATRIUM             Index        RIGHT ATRIUM           Index LA diam:        4.30 cm 2.16 cm/m   RA Area:     16.50 cm LA Vol (A2C):   62.6 ml 31.47 ml/m  RA Volume:   41.40 ml  20.82 ml/m LA Vol (A4C):   74.0 ml 37.21 ml/m LA Biplane Vol: 73.1 ml 36.75 ml/m  AORTIC VALVE LVOT Vmax:   114.00 cm/s LVOT Vmean:  68.700 cm/s LVOT VTI:    0.221 m  AORTA Ao Root diam: 2.90 cm Ao Asc diam:  3.60 cm MITRAL VALVE               TRICUSPID VALVE MV Area (PHT): 2.73 cm    TR Peak grad:   25.0 mmHg MV Decel Time: 278 msec    TR Vmax:        250.00 cm/s MV E velocity: 89.60 cm/s MV A velocity: 53.50 cm/s  SHUNTS MV E/A ratio:  1.67        Systemic VTI:  0.22 m                            Systemic Diam: 2.10 cm Jodelle Red MD Electronically signed by Jodelle Red MD Signature Date/Time: 12/04/2022/5:01:18 PM    Final      Additional studies/ records that were reviewed today include:  I reviewed the records of Dr. Christena Flake, as well as [facility] as well as his nuclear medicine stress test and 2D echo Doppler evaluations.  Laboratory was reviewed.  ASSESSMENT:    1. Primary hypertension   2. Coronary artery disease involving native coronary artery of native heart without angina pectoris   3. Hx of CABG   4. Hyperlipidemia with target LDL less than 55   5.  Postoperative atrial fibrillation (HCC)   6. On amiodarone therapy  7. Elevated Lp(a)   8. Type 2 diabetes mellitus without complication, with long-term current use of insulin ([place])   9. Medication management   10. Chronic kidney insufficiency, stage 4 (severe) (HCC)     PLAN:  Mr. Angello Chien is a 72 year old gentleman who is the wife of one of my longstanding patients.  When I saw him for my initial evaluation on on Mar 26, 2022 he admitted to at least a 10-year history of diabetes mellitus as well as hypertension.   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  He has experienced several episodes of chest discomfort associated with shortness of breath and diaphoresis.  Initially this had occurred while walking up a steep hill.  He has undergone prior evaluation with a Lexiscan Myoview study which was low risk but suggested possible small fixed defect inferiorly with EF 57%.  A  2D echo Doppler study has shown normal LV function with normal wall motion.  Only, he had begun to experience some recurrent chest pain symptomatology and had episodes which would occur at 1 to 69-month intervals concerning for angina.  He was on metoprolol succinate 50 mg and with mild blood pressure elevation and potential underlying ischemia I added amlodipine 5 mg.  He has not had any recurrent chest pain since initiation of amlodipine.  I obtained follow-up laboratory which revealed an excellent LDL cholesterol at 33 however LP(a) was significantly increased at 147.8.  Recommended that the patient initiate aspirin therapy.  He had normal TSH level.  His coronary CTA revealed an elevated calcium score at 1283 and raises concern for hemodynamic significance of LAD circumflex and OM 2 lesions.  I performed cardiac catheterization on May 06, 2022 which revealed severe multivessel CAD as noted in above cardiac catheterization report.  He underwent CABG revascularization surgery by  Dr. Kipp Brood on June 03, 2022 with a LIMA placed to his LAD, sequential vein graft to his OM1 and OM 3, and a vein graft to his PDA.  Postoperative course was complicated by early SVT as well as AF with RVR for which she was loaded with amiodarone.  When seen by Coletta Memos in August 2023 he was advised additional taper of amiodarone and since that time has been on 200 mg daily.  He is unaware of recurrent AF.  He underwent cardiac rehab in [place].  He feels stronger and is not having chest pain.  Blood pressure today is stable.  He is maintaining sinus rhythm.  I have recommended he reduce his amiodarone from 200 mg daily to 100 mg daily for the next 2 months and then he will decrease this to 100 mg every other day.  I will follow-up his postoperative renal insufficiency , stage 3b/4 and I am recommending laboratory be checked today with a comprehensive metabolic panel, TSH, CBC, and lipid panel.  In 3 months I will schedule him for follow-up echo Doppler evaluation with plans for office visit follow-up thereafter.   Medication Adjustments/Labs and Tests Ordered: Current medicines are reviewed at length with the patient today.  Concerns regarding medicines are outlined above.  Medication changes, Labs and Tests ordered today are listed in the Patient Instructions below. Patient Instructions  Medication Instructions:  Your physician has recommended you make the following change in your medication:  DECREASE: Amiodarone 100mg  for the next 2 months then take the amiodarone every other day   DECREASE: Amlodipine 2.5mg  daily  *If you need a refill  on your cardiac medications before your next appointment, please call your pharmacy*   Lab Work: Your physician recommends that you return at your earliest convenience to have the following labs drawn: CMET, CBC, TSH and Lipids.  If you have labs (blood work) drawn today and your tests are completely normal, you will receive your results only  by: MyChart Message (if you have MyChart) OR A paper copy in the mail If you have any lab test that is abnormal or we need to change your treatment, we will call you to review the results.   Testing/Procedures: Your physician has requested that you have an echocardiogram in 3 months. Echocardiography is a painless test that uses sound waves to create images of your heart. It provides your doctor with information about the size and shape of your heart and how well your heart's chambers and valves are working. This procedure takes approximately one hour. There are no restrictions for this procedure. Please do NOT wear cologne, perfume, aftershave, or lotions (deodorant is allowed). Please arrive 15 minutes prior to your appointment time.    Follow-Up: At [facility], you and your health needs are our priority.  As part of our continuing mission to provide you with exceptional heart care, we have created designated Provider Care Teams.  These Care Teams include your primary Cardiologist (physician) and Advanced Practice Providers (APPs -  Physician Assistants and Nurse Practitioners) who all work together to provide you with the care you need, when you need it.  We recommend signing up for the patient portal called "MyChart".  Sign up information is provided on this After Visit Summary.  MyChart is used to connect with patients for Virtual Visits (Telemedicine).  Patients are able to view lab/test results, encounter notes, upcoming appointments, etc.  Non-urgent messages can be sent to your provider as well.   To learn more about what you can do with MyChart, go to [email or web address].    Your next appointment:   3-4 month(s)  Provider:   [name], MD      Signed, [name], MD  12/04/2022 6:00 PM    [facility] [address], [place], [place]  [address] Phone: [phone]

## 2022-12-04 ENCOUNTER — Other Ambulatory Visit: Payer: Self-pay

## 2022-12-04 ENCOUNTER — Ambulatory Visit (HOSPITAL_COMMUNITY): Payer: Medicare Other | Attending: Cardiovascular Disease

## 2022-12-04 ENCOUNTER — Encounter: Payer: Self-pay | Admitting: Cardiovascular Disease

## 2022-12-04 DIAGNOSIS — Z79899 Other long term (current) drug therapy: Secondary | ICD-10-CM

## 2022-12-04 DIAGNOSIS — Z951 Presence of aortocoronary bypass graft: Secondary | ICD-10-CM | POA: Insufficient documentation

## 2022-12-04 DIAGNOSIS — E7800 Pure hypercholesterolemia, unspecified: Secondary | ICD-10-CM

## 2022-12-04 DIAGNOSIS — I2510 Atherosclerotic heart disease of native coronary artery without angina pectoris: Secondary | ICD-10-CM | POA: Diagnosis present

## 2022-12-04 LAB — ECHOCARDIOGRAM COMPLETE
Area-P 1/2: 2.73 cm2
S' Lateral: 3 cm

## 2022-12-05 LAB — COMPREHENSIVE METABOLIC PANEL
ALT: 15 IU/L (ref 0–44)
AST: 16 IU/L (ref 0–40)
Albumin/Globulin Ratio: 2.4 — ABNORMAL HIGH (ref 1.2–2.2)
Albumin: 4.6 g/dL (ref 3.8–4.8)
Alkaline Phosphatase: 89 IU/L (ref 44–121)
BUN/Creatinine Ratio: 18 (ref 10–24)
BUN: 45 mg/dL — ABNORMAL HIGH (ref 8–27)
Bilirubin Total: 0.4 mg/dL (ref 0.0–1.2)
CO2: 24 mmol/L (ref 20–29)
Calcium: 9 mg/dL (ref 8.6–10.2)
Chloride: 107 mmol/L — ABNORMAL HIGH (ref 96–106)
Creatinine, Ser: 2.46 mg/dL — ABNORMAL HIGH (ref 0.76–1.27)
Globulin, Total: 1.9 g/dL (ref 1.5–4.5)
Glucose: 78 mg/dL (ref 70–99)
Potassium: 4.8 mmol/L (ref 3.5–5.2)
Sodium: 145 mmol/L — ABNORMAL HIGH (ref 134–144)
Total Protein: 6.5 g/dL (ref 6.0–8.5)
eGFR: 27 mL/min/{1.73_m2} — ABNORMAL LOW (ref >59)

## 2022-12-05 LAB — CBC
Hematocrit: 30.1 % — ABNORMAL LOW (ref 37.5–51.0)
Hemoglobin: 9.8 g/dL — ABNORMAL LOW (ref 13.0–17.7)
MCH: 32.5 pg (ref 26.6–33.0)
MCHC: 32.6 g/dL (ref 31.5–35.7)
MCV: 100 fL — ABNORMAL HIGH (ref 79–97)
Platelets: 216 10*3/uL (ref 150–450)
RBC: 3.02 x10E6/uL — ABNORMAL LOW (ref 4.14–5.80)
RDW: 13.5 % (ref 11.6–15.4)
WBC: 5.9 10*3/uL (ref 3.4–10.8)

## 2022-12-05 LAB — LIPID PANEL
Chol/HDL Ratio: 2.7 ratio (ref 0.0–5.0)
Cholesterol, Total: 78 mg/dL — ABNORMAL LOW (ref 100–199)
HDL: 29 mg/dL — ABNORMAL LOW (ref >39)
LDL Chol Calc (NIH): 33 mg/dL (ref 0–99)
Triglycerides: 73 mg/dL (ref 0–149)
VLDL Cholesterol Cal: 16 mg/dL (ref 5–40)

## 2022-12-05 LAB — TSH: TSH: 1.48 u[IU]/mL (ref 0.450–4.500)

## 2023-03-02 ENCOUNTER — Encounter: Payer: Self-pay | Admitting: Cardiovascular Disease

## 2023-03-02 ENCOUNTER — Ambulatory Visit: Payer: Medicare Other | Attending: Cardiovascular Disease | Admitting: Cardiovascular Disease

## 2023-03-02 DIAGNOSIS — I4891 Unspecified atrial fibrillation: Secondary | ICD-10-CM | POA: Diagnosis present

## 2023-03-02 DIAGNOSIS — I2510 Atherosclerotic heart disease of native coronary artery without angina pectoris: Secondary | ICD-10-CM | POA: Diagnosis present

## 2023-03-02 DIAGNOSIS — Z79899 Other long term (current) drug therapy: Secondary | ICD-10-CM | POA: Insufficient documentation

## 2023-03-02 DIAGNOSIS — E7841 Elevated Lipoprotein(a): Secondary | ICD-10-CM | POA: Diagnosis present

## 2023-03-02 DIAGNOSIS — E785 Hyperlipidemia, unspecified: Secondary | ICD-10-CM

## 2023-03-02 DIAGNOSIS — N184 Chronic kidney disease, stage 4 (severe): Secondary | ICD-10-CM | POA: Diagnosis present

## 2023-03-02 DIAGNOSIS — R7989 Other specified abnormal findings of blood chemistry: Secondary | ICD-10-CM

## 2023-03-02 DIAGNOSIS — Z951 Presence of aortocoronary bypass graft: Secondary | ICD-10-CM | POA: Insufficient documentation

## 2023-03-02 NOTE — Progress Notes (Signed)
Cardiology Office Note    Date:  03/07/2023   ID:  Dustin Goodwin, DOB 1950-12-19, MRN [id number]  PCP:  Dustin Flake, MD  Cardiologist:  Dustin Guadalajara, MD   3 month F/U evaluation  History of Present Illness:  Dustin Goodwin is a 72 y.o. male who is the husband of my patient Dustin Asp  [name]) Goodwin.  He lives in [place], [place].  He has retired and works on his farm cutting wood and mowing grass.  He has a history of hypertension as well as a history of diabetes mellitus for greater than 10 years.  Approximately 1 year ago he had experienced some chest pain associated with shortness of breath and diaphoresis.  He has been followed at [facility] clinic in [place].  He subsequently had another episode which was relieved with nitroglycerin.  On August 27, 2021 he underwent a [place] Myoview study which raised the possibility of a small fixed defect inferiorly.  EF was 57% and there were no ECG changes.  He had an echo Doppler study on September 25, 2021 which showed an EF of 55 to 60% and he had normal wall motion.  Recently, he has experienced some recurrent episodes of chest discomfort and typically there may be 1 to 2 months without experiencing symptomatology.  His last episode occurred 1 week ago which was associated with chest pressure.  In the interim, he remains active and denies any exertional precipitation of discomfort.  Presently, he is on metoprolol succinate 50 mg daily, aspirin 81 mg milligrams, and has been on simvastatin 10 mg daily for hyperlipidemia.  He is on glimepiride, metformin and Ozempic for diabetes.  He has a history of neuropathy on gabapentin.  With his recurrent chest pains symptomatology he wished to be seen by me and I saw him for my initial evaluation on Mar 26, 2022.  At his initial evaluation, his blood pressure was elevated.  He had been on metoprolol succinate 50 mg daily and his ventricular rate was in the 60s.  I recommended follow-up  laboratory with a fasting lipid panel, TSH and LP(a).  I started him on amlodipine 5 mg both for blood pressure control and potential anti-ischemic benefit.  I recommended he undergo coronary CTA for further evaluation of his chest pain, and evaluate for coronary calcification and potential luminal stenosis.  I saw him on April 27, 2022.  At that time he denied any recurrent chest pain since instituting amlodipine and his blood pressure has improved.  He underwent coronary CTA on Apr 13, 2022 which revealed an elevated calcium score at 1283, representing 88 percentile for age, sex and race matched controls.  He was felt to have severe stenosis in the proximal and mid LAD in the range of 70 to 99% and severe stenosis in the distal circumflex and a range of 70 and 99% and OM 2 vessel.  There was mild 25 to 49% stenosis in the mid RCA.  Subsequent FFR analysis was performed which is positive for hemodynamic significance in the LAD at 0.56, distal LAD less than 0.50, the distal circumflex was felt possibly to be occluded.  OM 2 vessel was 0.71.    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  Due to his diffuse multivessel calcified disease CABG revascularization was recommended.    He underwent CABG x 4 on June 03, 2022 by Dr. Cliffton Asters and had a LIMA placed to his LAD, SVG  to the first and third marginal and PDA.  He developed a brief episode of SVT in the early morning postop day 1 treated with IV metoprolol.  He developed AF with RVR on postop day 2.  He was loaded with IV amniodarone.  He did have slight increase in serum creatinine.  He was subsequently evaluated by [name], NP on June 24, 2022 and felt well.  I saw him for my initial postoperative evaluation on December 03, 2022.  He has just completed cardiac rehab in [place] following 8 weeks.  He feels significantly stronger.  He denies chest pain.  He is unaware of  recurrent atrial fibrillation.  He has continued to be on reduced dose of amiodarone at 200 mg daily.  He is on atorvastatin 80 mg.  LP(a) was increased at 147.  His blood pressure had gotten low and he now is on reduced dose of amlodipine at 2.5 mg in addition to his metoprolol succinate 50 mg.  He is diabetic on glimepiride and metformin.  He has been taking over-the-counter fish oil.  During that evaluation, he was maintaining sinus rhythm.  I recommended he reduce his amiodarone from 20 mg down to 100 mg for 2 months and then reduce to every other day.  Follow-up laboratory was also recommended with follow-up echocardiographic evaluation.  He underwent an echo Doppler study on December 04, 2022.  This showed normal systolic function with EF 60 to 65% and normal wall motion.  There was mild to moderate left atrial dilatation and mild aortic sclerosis.    Presently, Dustin Goodwin feels well.  He denies chest pain or shortness of breath.  He has CKD with creatinine in January 2024 2.46.  He is now off amiodarone.  He continues to be on amlodipine 2.5 mg, metoprolol succinate 50 mg daily for CAD and hypertension.  He is diabetic on glimepiride and metformin.  He continues to be on atorvastatin 80 mg for hyperlipidemia.  He is unaware of any recurrent palpitations or shortness of breath.  He presents for evaluation.  Past medical history is notable for hypertension, type 2 diabetes mellitus, peripheral neuropathy and hyperlipidemia.  Surgical history is notable for leg and back surgery following a severe fall where he had fallen 50 feet resulting in significant injury.  He also status post hernia surgery.  Current Medications: Outpatient Medications Prior to Visit  Medication Sig Dispense Refill   amiodarone (PACERONE) 200 MG tablet Take 0.5 tablets (100 mg total) by mouth daily. 45 tablet 3   amLODipine (NORVASC) 2.5 MG tablet Take 1 tablet (2.5 mg total) by mouth daily. 90 tablet 3   aspirin 81 MG  chewable tablet Chew 81 mg by mouth daily.     atorvastatin (LIPITOR) 80 MG tablet Take 1 tablet (80 mg total) by mouth daily. 90 tablet 3   Coenzyme Q10 200 MG capsule Take 200 mg by mouth daily.     gabapentin (NEURONTIN) 300 MG capsule Take 300 mg by mouth 3 (three) times daily.     glimepiride (AMARYL) 4 MG tablet Take 2 mg by mouth daily.     metFORMIN (GLUCOPHAGE-XR) 750 MG 24 hr tablet Take 750 mg by mouth 2 (two) times daily.     metoprolol succinate (TOPROL-XL) 50 MG 24 hr tablet Take 50 mg by mouth daily.     nitroGLYCERIN (NITROSTAT) 0.4 MG SL tablet Place 0.4 mg under the tongue every 5 (five) minutes as needed for chest pain.     Omega-3 Fatty  Acids (FISH OIL PO) Take 1,500 mg by mouth daily.     acetaminophen (TYLENOL) 325 MG tablet Take 2 tablets (650 mg total) by mouth every 4 (four) hours as needed for mild pain or moderate pain.     ferrous fumarate-b12-vitamic C-folic acid (TRINSICON / FOLTRIN) capsule Take 1 capsule by mouth 2 (two) times daily after a meal. For 1 month then stop 60 capsule 0   No facility-administered medications prior to visit.     Allergies:   Patient has no known allergies.   Social History   Socioeconomic History   Marital status: Married    Spouse name: Not on file   Number of children: Not on file   Years of education: Not on file   Highest education level: Not on file  Occupational History   Not on file  Tobacco Use   Smoking status: Never   Smokeless tobacco: Never  Vaping Use   Vaping Use: Never used  Substance and Sexual Activity   Alcohol use: Never   Drug use: Never   Sexual activity: Not on file  Other Topics Concern   Not on file  Social History Narrative   Not on file   Social Determinants of Health   Financial Resource Strain: Not on file  Food Insecurity: Not on file  Transportation Needs: Not on file  Physical Activity: Not on file  Stress: Not on file  Social Connections: Not on file    Social history is  notable that he was born in [place].  He has been married for 46 years.  He retired at age 57 from [employer] which [employer].  There is no tobacco alcohol history.  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  He has 2 living brothers ages 55 and 51 both with diabetes.  He has a sister age 40.  ROS General: Negative; No fevers, chills, or night sweats;  HEENT: Negative; No changes in vision or hearing, sinus congestion, difficulty swallowing Pulmonary: Negative; No cough, wheezing, shortness of breath, hemoptysis Cardiovascular: See HPI GI: Negative; No nausea, vomiting, diarrhea, or abdominal pain GU: Negative; No dysuria, hematuria, or difficulty voiding Musculoskeletal: Remote fall leading to back and leg issues Hematologic/Oncology: Negative; no easy bruising, bleeding Endocrine: Negative; no heat/cold intolerance; no diabetes Neuro: Negative; no changes in balance, headaches Skin: Negative; No rashes or skin lesions Psychiatric: Negative; No behavioral problems, depression Sleep: Negative; No snoring, daytime sleepiness, hypersomnolence, bruxism, restless legs, hypnogognic hallucinations, no cataplexy Other comprehensive 14 point system review is negative.   PHYSICAL EXAM:   VS:  BP 110/60 (BP Location: Left Arm, Patient Position: Sitting, Cuff Size: Normal)   Pulse (!) 53   Ht 5\' 8"  (1.727 m)   Wt 183 lb (83 kg)   SpO2 95%   BMI 27.83 kg/m     Repeat blood pressure by me was 112/62  Wt Readings from Last 3 Encounters:  03/02/23 183 lb (83 kg)  12/03/22 187 lb 9.6 oz (85.1 kg)  07/23/22 174 lb (78.9 kg)   General: Alert, oriented, no distress.  Skin: normal turgor, no rashes, warm and dry HEENT: Normocephalic, atraumatic. Pupils equal round and reactive to light; sclera anicteric; extraocular muscles intact;  Nose without  nasal septal hypertrophy Mouth/Parynx benign; Mallinpatti scale 3 Neck: No JVD, no carotid bruits; normal carotid upstroke Lungs: clear to ausculatation and percussion;  no wheezing or rales Chest wall: without tenderness to palpitation Heart: PMI not displaced, RRR, s1 s2 normal, 1/6 systolic murmur, no diastolic murmur, no rubs, gallops, thrills, or heaves Abdomen: soft, nontender; no hepatosplenomehaly, BS+; abdominal aorta nontender and not dilated by palpation. Back: no CVA tenderness Pulses 2+ Musculoskeletal: full range of motion, normal strength, no joint deformities Extremities: no clubbing cyanosis or edema, Homan's sign negative  Neurologic: grossly nonfocal; Cranial nerves grossly wnl Psychologic: Normal mood and affect   Studies/Labs Reviewed:   March 02, 2023 ECG (independently read by me): Sinus bradycardia at 53, no ectopy.  Normal intervals  December 03, 2022 ECG (independently read by me): NSR at 66, no ST changes, no ectopy  April 27, 2022 ECG (independently read by me): Sinus bradycardia at 59, no ST changes  Mar 26, 2022 ECG (independently read by me):  NSR at 68, no STT changes, no ectopy, normal intervals  Recent Labs: He brought with him recent laboratory from Dr. Christena Goodwin from March 20, 2022. Hemoglobin 14.9/hematocrit 43.4/platelets 229,000 Potassium 4.1, creatinine 1.32.  Glucose 155, AST 26, ALT 24.     Latest Ref Rng & Units 03/02/2023   11:39 AM 12/04/2022    9:36 AM 06/08/2022    2:56 AM  BMP  Glucose 70 - 99 mg/dL 409  78  811   BUN 8 - 27 mg/dL 46  45  30   Creatinine 0.76 - 1.27 mg/dL 9.14  7.82  9.56   BUN/Creat Ratio 10 - 24 19  18     Sodium 134 - 144 mmol/L 144  145  140   Potassium 3.5 - 5.2 mmol/L 5.5  4.8  4.6   Chloride 96 - 106 mmol/L 104  107  105   CO2 20 - 29 mmol/L 24  24  27    Calcium 8.6 - 10.2 mg/dL 9.5  9.0  8.8         Latest Ref Rng & Units 12/04/2022    9:36 AM 05/29/2022   11:07 AM  Hepatic Function  Total Protein 6.0  - 8.5 g/dL 6.5  6.8   Albumin 3.8 - 4.8 g/dL 4.6  4.0   AST 0 - 40 IU/L 16  19   ALT 0 - 44 IU/L 15  18   Alk Phosphatase 44 - 121 IU/L 89  60   Total Bilirubin 0.0 - 1.2 mg/dL 0.4  1.0        Latest Ref Rng & Units 12/04/2022    9:36 AM 06/08/2022    2:56 AM 06/06/2022   12:33 AM  CBC  WBC 3.4 - 10.8 x10E3/uL 5.9  7.0  9.7   Hemoglobin 13.0 - 17.7 g/dL 9.8  21.3  08.6   Hematocrit 37.5 - 51.0 % 30.1  30.1  28.9   Platelets 150 - 450 x10E3/uL 216  264  191    Lab Results  Component Value Date   MCV 100 (H) 12/04/2022   MCV 96.5 06/08/2022   MCV 96.0 06/06/2022   Lab Results  Component Value Date   TSH 1.480 12/04/2022   Lab Results  Component Value Date   HGBA1C 6.6 (H) 05/29/2022     BNP No results found for: "BNP"  ProBNP No results found for: "PROBNP"   Lipid Panel     Component Value Date/Time   CHOL 78 (L) 12/04/2022 0936   TRIG 73 12/04/2022 0936   HDL 29 (L) 12/04/2022 0936   CHOLHDL 2.7 12/04/2022 0936  LDLCALC 33 12/04/2022 0936   LABVLDL 16 12/04/2022 0936     RADIOLOGY: No results found.   Additional studies/ records that were reviewed today include:  I reviewed the records of Dr. Christena Goodwin, as well as [facility] clinic as well as his nuclear medicine stress test and 2D echo Doppler evaluations.  Laboratory was reviewed.  ASSESSMENT:    1. Hx of CABG   2. Coronary artery disease involving native coronary artery of native heart without angina pectoris   3. Atrial fibrillation: Postoperative, resolved   4. Chronic kidney insufficiency, stage 4 (severe)   5. Elevated Lp(a)   6. Hyperlipidemia with target LDL less than 55   7. Medication management     PLAN:  [name] is a 72 year old gentleman who is the wife of one of my longstanding patients.  When I saw him for my initial evaluation on on Mar 26, 2022 he admitted to at least a 10-year history of diabetes mellitus as well as hypertension.   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  He has experienced several episodes of chest discomfort associated with shortness of breath and diaphoresis.  Initially this had occurred while walking up a steep hill.  He has undergone prior evaluation with a [place] Myoview study which was low risk but suggested possible small fixed defect inferiorly with EF 57%.  A  2D echo Doppler study has shown normal LV function with normal wall motion.  He had started  to experience some recurrent chest pain symptomatology and had episodes which would occur at 1 to 49-month intervals concerning for angina.  He was on metoprolol succinate 50 mg and with mild blood pressure elevation and potential underlying ischemia I added amlodipine 5 mg.  He has not had any recurrent chest pain since initiation of amlodipine.  I obtained follow-up laboratory which revealed an excellent LDL cholesterol at 33 however LP(a) was significantly increased at 147.8.  Recommended that the patient initiate aspirin therapy.  He had normal TSH level.  His coronary CTA revealed an elevated calcium score at 1283 and raised concern for hemodynamic significance of LAD circumflex and OM 2 lesions.  Cardiac catheterization on May 06, 2022 revealed severe multivessel CAD as noted in above cardiac catheterization report.  He underwent CABG revascularization surgery by Dr. Cliffton Asters on June 03, 2022 with a LIMA placed to his LAD, sequential vein graft to his OM1 and OM 3, and a vein graft to his PDA.  Postoperative course was complicated by early SVT as well as AF with RVR for which she was loaded with amiodarone.  When seen by Edd Fabian in August 2023 he was advised additional taper of amiodarone. He underwent cardiac rehab in [place].  Since I last saw him in the office in January 2024, he has continued to feel well.  He is now off amiodarone.  He has not had recurrent atrial fibrillation.  He has stage IIIb/IV CKD.  Laboratory in January  showed a creatinine of 2.46.  Echocardiography on December 04, 2022 showed normal LV function with EF 60 to 65% with mild to moderate LA dilatation and aortic valve sclerosis.  His ECG today is stable with sinus bradycardia 53 bpm.  I am recommending a follow-up being met to reassess renal function.  He will continue current therapy.  Most recent LDL cholesterol was 33 on atorvastatin 80 mg.  I will see him in 7 months for follow-up evaluation  or sooner as needed.   Medication Adjustments/Labs and Tests Ordered: Current medicines are reviewed at length with the patient today.  Concerns regarding medicines are outlined above.  Medication changes, Labs and Tests ordered today are listed in the Patient Instructions below. Patient Instructions  Medication Instructions:   No changes *If you need a refill on your cardiac medications before your next appointment, please call your pharmacy*   Lab Work: BMP   If you have labs (blood work) drawn today and your tests are completely normal, you will receive your results only by: MyChart Message (if you have MyChart) OR A paper copy in the mail If you have any lab test that is abnormal or we need to change your treatment, we will call you to review the results.   Testing/Procedures: Not needed   Follow-Up: At [facility], you and your health needs are our priority.  As part of our continuing mission to provide you with exceptional heart care, we have created designated Provider Care Teams.  These Care Teams include your primary Cardiologist (physician) and Advanced Practice Providers (APPs -  Physician Assistants and Nurse Practitioners) who all work together to provide you with the care you need, when you need it.     Your next appointment:   6 month(s)  The format for your next appointment:   In Person  Provider:   Nicki Guadalajarahomas Vernel Donlan, MD    Other Instructions    Signed, Dustin Guadalajarahomas Lakishia Bourassa, MD  03/07/2023 3:41 PM    [facility] [address], [address], [place], [place]  [id number] Phone: [phone]) [phone]

## 2023-03-02 NOTE — Patient Instructions (Addendum)
Medication Instructions:   No changes *If you need a refill on your cardiac medications before your next appointment, please call your pharmacy*   Lab Work: BMP   If you have labs (blood work) drawn today and your tests are completely normal, you will receive your results only by: MyChart Message (if you have MyChart) OR A paper copy in the mail If you have any lab test that is abnormal or we need to change your treatment, we will call you to review the results.   Testing/Procedures: Not needed   Follow-Up: At [facility], you and your health needs are our priority.  As part of our continuing mission to provide you with exceptional heart care, we have created designated Provider Care Teams.  These Care Teams include your primary Cardiologist (physician) and Advanced Practice Providers (APPs -  Physician Assistants and Nurse Practitioners) who all work together to provide you with the care you need, when you need it.     Your next appointment:   6 month(s)  The format for your next appointment:   In Person  Provider:   [name], MD    Other Instructions

## 2023-03-03 LAB — BASIC METABOLIC PANEL
BUN/Creatinine Ratio: 19 (ref 10–24)
BUN: 46 mg/dL — ABNORMAL HIGH (ref 8–27)
CO2: 24 mmol/L (ref 20–29)
Calcium: 9.5 mg/dL (ref 8.6–10.2)
Chloride: 104 mmol/L (ref 96–106)
Creatinine, Ser: 2.42 mg/dL — ABNORMAL HIGH (ref 0.76–1.27)
Glucose: 146 mg/dL — ABNORMAL HIGH (ref 70–99)
Potassium: 5.5 mmol/L — ABNORMAL HIGH (ref 3.5–5.2)
Sodium: 144 mmol/L (ref 134–144)
eGFR: 28 mL/min/{1.73_m2} — ABNORMAL LOW (ref >59)

## 2023-03-07 ENCOUNTER — Encounter: Payer: Self-pay | Admitting: Cardiovascular Disease

## 2023-03-08 ENCOUNTER — Other Ambulatory Visit: Payer: Self-pay

## 2023-03-08 ENCOUNTER — Telehealth: Payer: Self-pay | Admitting: Cardiovascular Disease

## 2023-03-08 DIAGNOSIS — E875 Hyperkalemia: Secondary | ICD-10-CM

## 2023-03-08 NOTE — Telephone Encounter (Signed)
Thanks

## 2023-03-08 NOTE — Telephone Encounter (Signed)
FYI--Patient's wife called to let Dr. Tresa Endo know that they saw lab results and they plan to come by the office on 4/17 for recheck.

## 2023-03-10 ENCOUNTER — Other Ambulatory Visit: Payer: Self-pay

## 2023-03-10 ENCOUNTER — Other Ambulatory Visit: Payer: Self-pay | Admitting: Cardiovascular Disease

## 2023-03-10 DIAGNOSIS — R072 Precordial pain: Secondary | ICD-10-CM

## 2023-03-10 DIAGNOSIS — E875 Hyperkalemia: Secondary | ICD-10-CM

## 2023-03-11 LAB — BASIC METABOLIC PANEL
BUN/Creatinine Ratio: 17 (ref 10–24)
BUN: 39 mg/dL — ABNORMAL HIGH (ref 8–27)
CO2: 21 mmol/L (ref 20–29)
Calcium: 9.4 mg/dL (ref 8.6–10.2)
Chloride: 104 mmol/L (ref 96–106)
Creatinine, Ser: 2.33 mg/dL — ABNORMAL HIGH (ref 0.76–1.27)
Glucose: 169 mg/dL — ABNORMAL HIGH (ref 70–99)
Potassium: 5.4 mmol/L — ABNORMAL HIGH (ref 3.5–5.2)
Sodium: 142 mmol/L (ref 134–144)
eGFR: 29 mL/min/{1.73_m2} — ABNORMAL LOW (ref >59)

## 2023-05-31 ENCOUNTER — Other Ambulatory Visit: Payer: Self-pay | Admitting: General Practice

## 2023-08-08 ENCOUNTER — Other Ambulatory Visit: Payer: Self-pay | Admitting: Cardiovascular Disease

## 2023-08-08 DIAGNOSIS — R072 Precordial pain: Secondary | ICD-10-CM

## 2023-09-01 ENCOUNTER — Ambulatory Visit: Payer: Medicare Other | Attending: Cardiovascular Disease | Admitting: Cardiovascular Disease

## 2023-09-01 ENCOUNTER — Encounter: Payer: Self-pay | Admitting: Cardiovascular Disease

## 2023-09-01 VITALS — BP 124/86 | HR 45 | Ht 68.0 in | Wt 192.2 lb

## 2023-09-01 DIAGNOSIS — N184 Chronic kidney disease, stage 4 (severe): Secondary | ICD-10-CM | POA: Diagnosis present

## 2023-09-01 DIAGNOSIS — I2510 Atherosclerotic heart disease of native coronary artery without angina pectoris: Secondary | ICD-10-CM

## 2023-09-01 DIAGNOSIS — E7841 Elevated Lipoprotein(a): Secondary | ICD-10-CM

## 2023-09-01 DIAGNOSIS — E875 Hyperkalemia: Secondary | ICD-10-CM

## 2023-09-01 DIAGNOSIS — E785 Hyperlipidemia, unspecified: Secondary | ICD-10-CM

## 2023-09-01 DIAGNOSIS — I5083 High output heart failure: Secondary | ICD-10-CM

## 2023-09-01 DIAGNOSIS — Z951 Presence of aortocoronary bypass graft: Secondary | ICD-10-CM | POA: Diagnosis present

## 2023-09-01 DIAGNOSIS — N2889 Other specified disorders of kidney and ureter: Secondary | ICD-10-CM

## 2023-09-01 DIAGNOSIS — E119 Type 2 diabetes mellitus without complications: Secondary | ICD-10-CM

## 2023-09-01 NOTE — Patient Instructions (Addendum)
Medication Instructions:  Take the Metoprolol Succinate 50mg  one day, 25mg  (half tablet) the next day. Do this for 1 week   Continue to monitor your heart rate.  If your heart rate is still reading in the 40's, take the Metoprolol Succinate only one half pill a day only.  *If you need a refill on your cardiac medications before your next appointment, please call your pharmacy*   Lab Work: You will return in March of 2025 for fasting labs CMET, LIPID, CBC, TSH If you have labs (blood work) drawn today and your tests are completely normal, you will receive your results only by: MyChart Message (if you have MyChart) OR A paper copy in the mail If you have any lab test that is abnormal or we need to change your treatment, we will call you to review the results.   Testing/Procedures:  none   Follow-Up: At [facility], you and your health needs are our priority.  As part of our continuing mission to provide you with exceptional heart care, we have created designated Provider Care Teams.  These Care Teams include your primary Cardiologist (physician) and Advanced Practice Providers (APPs -  Physician Assistants and Nurse Practitioners) who all work together to provide you with the care you need, when you need it.  We recommend signing up for the patient portal called "MyChart".  Sign up information is provided on this After Visit Summary.  MyChart is used to connect with patients for Virtual Visits (Telemedicine).  Patients are able to view lab/test results, encounter notes, upcoming appointments, etc.  Non-urgent messages can be sent to your provider as well.   To learn more about what you can do with MyChart, go to [email or web address].    Your next appointment:   6 month(s)  Provider:   [name], MD

## 2023-09-01 NOTE — Progress Notes (Signed)
Cardiology Office Note    Date:  09/04/2023   ID:  Dustin Goodwin, DOB Oct 21, 1951, MRN [id number]  PCP:  Christena Flake, MD  Cardiologist:  [name], MD   6 month F/U evaluation  History of Present Illness:  Dustin Goodwin is a 72 y.o. male who is the husband of my patient Dustin Goodwin.  He lives in [place], [place].  He has retired and works on his farm cutting wood and mowing grass.  He has a history of hypertension as well as a history of diabetes mellitus for greater than 10 years.  Approximately 1 year ago he had experienced some chest pain associated with shortness of breath and diaphoresis.  He has been followed at [facility] clinic in [place].  He subsequently had another episode which was relieved with nitroglycerin.  On August 27, 2021 he underwent a [place] Myoview study which raised the possibility of a small fixed defect inferiorly.  EF was 57% and there were no ECG changes.  He had an echo Doppler study on September 25, 2021 which showed an EF of 55 to 60% and he had normal wall motion.  Recently, he has experienced some recurrent episodes of chest discomfort and typically there may be 1 to 2 months without experiencing symptomatology.  His last episode occurred 1 week ago which was associated with chest pressure.  In the interim, he remains active and denies any exertional precipitation of discomfort.  Presently, he is on metoprolol succinate 50 mg daily, aspirin 81 mg milligrams, and has been on simvastatin 10 mg daily for hyperlipidemia.  He is on glimepiride, metformin and Ozempic for diabetes.  He has a history of neuropathy on gabapentin.  With his recurrent chest pains symptomatology he wished to be seen by me and I saw him for my initial evaluation on Mar 26, 2022.  At his initial evaluation, his blood pressure was elevated.  He had been on metoprolol succinate 50 mg daily and his ventricular rate was in the 60s.  I recommended follow-up laboratory  with a fasting lipid panel, TSH and LP(a).  I started him on amlodipine 5 mg both for blood pressure control and potential anti-ischemic benefit.  I recommended he undergo coronary CTA for further evaluation of his chest pain, and evaluate for coronary calcification and potential luminal stenosis.  I saw him on April 27, 2022.  At that time he denied any recurrent chest pain since instituting amlodipine and his blood pressure has improved.  He underwent coronary CTA on Apr 13, 2022 which revealed an elevated calcium score at 1283, representing 88 percentile for age, sex and race matched controls.  He was felt to have severe stenosis in the proximal and mid LAD in the range of 70 to 99% and severe stenosis in the distal circumflex and a range of 70 and 99% and OM 2 vessel.  There was mild 25 to 49% stenosis in the mid RCA.  Subsequent FFR analysis was performed which is positive for hemodynamic significance in the LAD at 0.56, distal LAD less than 0.50, the distal circumflex was felt possibly to be occluded.  OM 2 vessel was 0.71.    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  Due to his diffuse multivessel calcified disease CABG revascularization was recommended.    He underwent CABG x 4 on June 03, 2022 by Dr. Cliffton Asters and had a LIMA placed to his LAD, SVG to the  first and third marginal and PDA.  He developed a brief episode of SVT in the early morning postop day 1 treated with IV metoprolol.  He developed AF with RVR on postop day 2.  He was loaded with IV amniodarone.  He did have slight increase in serum creatinine.  He was subsequently evaluated by Edd Fabian, NP on June 24, 2022 and felt well.  I saw him for my initial postoperative evaluation on December 03, 2022.  He has just completed cardiac rehab in [place] following 8 weeks.  He feels significantly stronger.  He denies chest pain.  He is unaware of recurrent  atrial fibrillation.  He has continued to be on reduced dose of amiodarone at 200 mg daily.  He is on atorvastatin 80 mg.  LP(a) was increased at 147.  His blood pressure had gotten low and he now is on reduced dose of amlodipine at 2.5 mg in addition to his metoprolol succinate 50 mg.  He is diabetic on glimepiride and metformin.  He has been taking over-the-counter fish oil.  During that evaluation, he was maintaining sinus rhythm.  I recommended he reduce his amiodarone from 20 mg down to 100 mg for 2 months and then reduce to every other day.  Follow-up laboratory was also recommended with follow-up echocardiographic evaluation.  He underwent an echo Doppler study on December 04, 2022.  This showed normal systolic function with EF 60 to 65% and normal wall motion.  There was mild to moderate left atrial dilatation and mild aortic sclerosis.    I last saw him on March 02, 2023 at which time he felt well and denied chest pain or shortness of breath. He has CKD with creatinine in January 2024 at 2.46.  He is now off amiodarone.  He continues to be on amlodipine 2.5 mg, metoprolol succinate 50 mg daily for CAD and hypertension.  He is diabetic on glimepiride and metformin.  He continues to be on atorvastatin 80 mg for hyperlipidemia.  He was unaware of any recurrent palpitations or shortness of breath.  He presents for evaluation.  Since I last saw him, he is followed by Dr. Tanda Rockers for primary care.  Presently, he feels well and denies chest pain or shortness of breath.  He remains active.  He denies any dizziness or lightheadedness.  Heart rate has tended to be low.  He continues to be on amiodarone 100 mg daily, amlodipine 2.5 mg, isosorbide 30 mg, metoprolol succinate 50 mg daily.  He is diabetic on metformin.  He has neuropathy on gabapentin.  Currently he is on atorvastatin 80 mg daily for hyperlipidemia.  Past medical history is notable for hypertension, type 2 diabetes mellitus, peripheral neuropathy  and hyperlipidemia.  Surgical history is notable for leg and back surgery following a severe fall where he had fallen 50 feet resulting in significant injury.  He also status post hernia surgery.  Current Medications: Outpatient Medications Prior to Visit  Medication Sig Dispense Refill   amiodarone (PACERONE) 200 MG tablet Take 0.5 tablets (100 mg total) by mouth daily. 45 tablet 3   amLODipine (NORVASC) 5 MG tablet TAKE 1 TABLET (5 MG TOTAL) BY MOUTH DAILY. 90 tablet 1   aspirin 81 MG chewable tablet Chew 81 mg by mouth daily.     atorvastatin (LIPITOR) 80 MG tablet TAKE 1 TABLET BY MOUTH EVERY DAY 90 tablet 2   Coenzyme Q10 200 MG capsule Take 200 mg by mouth daily.     gabapentin (NEURONTIN)  300 MG capsule Take 300 mg by mouth 3 (three) times daily.     glimepiride (AMARYL) 4 MG tablet Take 2 mg by mouth daily.     HYDROcodone-acetaminophen (NORCO) 7.5-325 MG tablet Take 1 tablet by mouth every 6 (six) hours as needed for moderate pain or severe pain.     isosorbide mononitrate (IMDUR) 30 MG 24 hr tablet Take 30 mg by mouth daily.     metFORMIN (GLUCOPHAGE-XR) 750 MG 24 hr tablet Take 750 mg by mouth daily with breakfast.     metoprolol succinate (TOPROL-XL) 50 MG 24 hr tablet Take 50 mg by mouth daily.     nitroGLYCERIN (NITROSTAT) 0.4 MG SL tablet Place 0.4 mg under the tongue every 5 (five) minutes as needed for chest pain.     Omega-3 Fatty Acids (FISH OIL PO) Take 1,500 mg by mouth daily.     amLODipine (NORVASC) 2.5 MG tablet Take 1 tablet (2.5 mg total) by mouth daily. (Patient not taking: Reported on 09/01/2023) 90 tablet 3   No facility-administered medications prior to visit.     Allergies:   Patient has no known allergies.   Social History   Socioeconomic History   Marital status: Married    Spouse name: Not on file   Number of children: Not on file   Years of education: Not on file   Highest education level: Not on file  Occupational History   Not on file  Tobacco  Use   Smoking status: Never   Smokeless tobacco: Never  Vaping Use   Vaping status: Never Used  Substance and Sexual Activity   Alcohol use: Never   Drug use: Never   Sexual activity: Not on file  Other Topics Concern   Not on file  Social History Narrative   Not on file   Social Determinants of Health   Financial Resource Strain: Not on file  Food Insecurity: Not on file  Transportation Needs: Not on file  Physical Activity: Not on file  Stress: Not on file  Social Connections: Not on file    Social history is notable that he was born in [place].  He has been married for 46 years.  He retired at age 73 from [employer] which [employer].  There is no tobacco alcohol history.  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  He has 2 living brothers ages 43 and 47 both with diabetes.  He has a sister age 74.  ROS General: Negative; No fevers, chills, or night sweats;  HEENT: Negative; No changes in vision or hearing, sinus congestion, difficulty swallowing Pulmonary: Negative; No cough, wheezing, shortness of breath, hemoptysis Cardiovascular: See HPI GI: Negative; No nausea, vomiting, diarrhea, or abdominal pain GU: Negative; No dysuria, hematuria, or difficulty voiding Musculoskeletal: Remote fall leading to back and leg issues Hematologic/Oncology: Negative; no easy bruising, bleeding Endocrine: Negative; no heat/cold intolerance; no diabetes Neuro: Negative; no changes in balance, headaches Skin: Negative; No rashes or skin lesions Psychiatric: Negative; No behavioral problems, depression Sleep: Negative; No snoring, daytime sleepiness, hypersomnolence, bruxism, restless legs, hypnogognic hallucinations, no cataplexy Other comprehensive 14 point system review is negative.   PHYSICAL EXAM:   VS:  BP 124/86   Pulse (!) 45    Ht 5\' 8"  (1.727 m)   Wt 192 lb 3.2 oz (87.2 kg)   SpO2 95%   BMI  29.22 kg/m     Repeat blood pressure by me was 136/76  Wt Readings from Last 3 Encounters:  09/01/23 192 lb 3.2 oz (87.2 kg)  03/02/23 183 lb (83 kg)  12/03/22 187 lb 9.6 oz (85.1 kg)   General: Alert, oriented, no distress.  Skin: normal turgor, no rashes, warm and dry HEENT: Normocephalic, atraumatic. Pupils equal round and reactive to light; sclera anicteric; extraocular muscles intact;  Nose without nasal septal hypertrophy Mouth/Parynx benign; Mallinpatti scale 3 Neck: No JVD, no carotid bruits; normal carotid upstroke Lungs: clear to ausculatation and percussion; no wheezing or rales Chest wall: without tenderness to palpitation Heart: PMI not displaced, bradycardic in the mid 40s, s1 s2 normal, 1/6 systolic murmur, no diastolic murmur, no rubs, gallops, thrills, or heaves Abdomen: soft, nontender; no hepatosplenomehaly, BS+; abdominal aorta nontender and not dilated by palpation. Back: no CVA tenderness Pulses 2+ Musculoskeletal: full range of motion, normal strength, no joint deformities Extremities: no clubbing cyanosis or edema, Homan's sign negative  Neurologic: grossly nonfocal; Cranial nerves grossly wnl Psychologic: Normal mood and affect    Studies/Labs Reviewed:   EKG Interpretation Date/Time:  Wednesday September 01 2023 11:52:01 EDT Ventricular Rate:  44 PR Interval:  210 QRS Duration:  78 QT Interval:  460 QTC Calculation: 393 R Axis:   40  Text Interpretation: Marked sinus bradycardia with 1st degree A-V block When compared with ECG of 05-Jun-2022 11:02, Sinus rhythm has replaced Atrial fibrillation Vent. rate has decreased BY 101 BPM Nonspecific T wave abnormality no longer evident in Inferior leads Confirmed by [name] ([id number]) on 09/04/2023 12:30:47 PM    March 02, 2023 ECG (independently read by me): Sinus bradycardia at 53, no ectopy.  Normal intervals  December 03, 2022 ECG  (independently read by me): NSR at 66, no ST changes, no ectopy  April 27, 2022 ECG (independently read by me): Sinus bradycardia at 59, no ST changes  Mar 26, 2022 ECG (independently read by me):  NSR at 68, no STT changes, no ectopy, normal intervals  Recent Labs: He brought with him recent laboratory from Dr. Christena Flake from March 20, 2022. Hemoglobin 14.9/hematocrit 43.4/platelets 229,000 Potassium 4.1, creatinine 1.32.  Glucose 155, AST 26, ALT 24.     Latest Ref Rng & Units 03/10/2023   11:24 AM 03/02/2023   11:39 AM 12/04/2022    9:36 AM  BMP  Glucose 70 - 99 mg/dL 191  478  78   BUN 8 - 27 mg/dL 39  46  45   Creatinine 0.76 - 1.27 mg/dL 2.95  6.21  3.08   BUN/Creat Ratio 10 - 24 17  19  18    Sodium 134 - 144 mmol/L 142  144  145   Potassium 3.5 - 5.2 mmol/L 5.4  5.5  4.8   Chloride 96 - 106 mmol/L 104  104  107   CO2 20 - 29 mmol/L 21  24  24    Calcium 8.6 - 10.2 mg/dL 9.4  9.5  9.0         Latest Ref Rng & Units 12/04/2022    9:36 AM 05/29/2022   11:07 AM  Hepatic Function  Total Protein 6.0 - 8.5 g/dL 6.5  6.8   Albumin 3.8 - 4.8 g/dL 4.6  4.0   AST 0 - 40 IU/L 16  19   ALT 0 - 44 IU/L 15  18   Alk Phosphatase 44 - 121 IU/L 89  60   Total Bilirubin 0.0 -  1.2 mg/dL 0.4  1.0        Latest Ref Rng & Units 12/04/2022    9:36 AM 06/08/2022    2:56 AM 06/06/2022   12:33 AM  CBC  WBC 3.4 - 10.8 x10E3/uL 5.9  7.0  9.7   Hemoglobin 13.0 - 17.7 g/dL 9.8  13.0  86.5   Hematocrit 37.5 - 51.0 % 30.1  30.1  28.9   Platelets 150 - 450 x10E3/uL 216  264  191    Lab Results  Component Value Date   MCV 100 (H) 12/04/2022   MCV 96.5 06/08/2022   MCV 96.0 06/06/2022   Lab Results  Component Value Date   TSH 1.480 12/04/2022   Lab Results  Component Value Date   HGBA1C 6.6 (H) 05/29/2022     BNP No results found for: "BNP"  ProBNP No results found for: "PROBNP"   Lipid Panel     Component Value Date/Time   CHOL 78 (L) 12/04/2022 0936   TRIG 73 12/04/2022  0936   HDL 29 (L) 12/04/2022 0936   CHOLHDL 2.7 12/04/2022 0936   LDLCALC 33 12/04/2022 0936   LABVLDL 16 12/04/2022 0936     RADIOLOGY: No results found.   Additional studies/ records that were reviewed today include:  I reviewed the records of Dr. Christena Flake, as well as [facility] clinic as well as his nuclear medicine stress test and 2D echo Doppler evaluations.  Laboratory was reviewed.  ASSESSMENT:    1. Coronary artery disease involving native coronary artery of native heart without angina pectoris   2. S/P CABG x 4   3. Elevated Lp(a)   4. Hyperlipidemia with target LDL less than 50   5. Type 2 diabetes mellitus without complication, without long-term current use of insulin (HCC)   6. Chronic kidney insufficiency, stage 4 (severe) (HCC)     PLAN:  [name] is a 72 year old gentleman who is the wife of one of my longstanding patients.  When I saw him for my initial evaluation on on Mar 26, 2022 he admitted to at least a 10-year history of diabetes mellitus as well as hypertension.   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  He experienced several episodes of chest discomfort associated with shortness of breath and diaphoresis.  Initially this had occurred while walking up a steep hill.  He has undergone prior evaluation with a [place] Myoview study which was low risk but suggested possible small fixed defect inferiorly with EF 57%.  A  2D echo Doppler study has shown normal LV function with normal wall motion.  He had started  to experience some recurrent chest pain symptomatology and had episodes which would occur at 1 to 83-month intervals concerning for angina.  He was on metoprolol succinate 50 mg and with mild blood pressure elevation and potential underlying ischemia I added amlodipine 5 mg.  He has not had any recurrent chest pain since initiation of amlodipine.  I obtained follow-up laboratory which revealed an  excellent LDL cholesterol at 33 however LP(a) was significantly increased at 147.8.  Recommended that the patient initiate aspirin therapy.  He had normal TSH level.  His coronary CTA revealed an elevated calcium score at 1283 and raised concern for hemodynamic significance of LAD circumflex and OM 2 lesions.  Cardiac catheterization on May 06, 2022 revealed severe multivessel CAD as noted in above cardiac catheterization report.  He underwent CABG revascularization surgery  by Dr. Cliffton Asters on June 03, 2022 with a LIMA placed to his LAD, sequential vein graft to his OM1 and OM 3, and a vein graft to his PDA.  Postoperative course was complicated by early SVT as well as AF with RVR for which she was loaded with amiodarone.  When seen by Edd Fabian in August 2023 he was advised additional taper of amiodarone. He underwent cardiac rehab in [place].  Since I last saw him in the office in January 2024, he has continued to feel well.  When I saw him in April 2024 he was off amiodarone and maintaining sinus rhythm.  He has stage IV CKD with most recent creatinine on March 10, 2023 at 2.33 and estimated GFR at 21.  Laboratory in January showed a creatinine of 2.46.  Echocardiography on December 04, 2022 showed normal LV function with EF 60 to 65% with mild to moderate LA dilatation and aortic valve sclerosis.  His ECG today is stable with sinus bradycardia 53 bpm.  Presently he feels well and denies chest pain or shortness of breath.  He remains very active and can do more strenuous activity.  His ECG shows sinus bradycardia at 44.  He is supposed to be off amiodarone.  I am recommending further reduction of metoprolol succinate from 50 mg daily down to 50 mg alternating with 25 mg every other day.  He continues to be on aspirin with atorvastatin with lipid panel in January 24 showing an LDL of 33.  LP(a) is significantly elevated at 147.8.  I will see him in March 2024 and prior to that visit follow-up laboratory  will be obtained.   Medication Adjustments/Labs and Tests Ordered: Current medicines are reviewed at length with the patient today.  Concerns regarding medicines are outlined above.  Medication changes, Labs and Tests ordered today are listed in the Patient Instructions below. Patient Instructions  Medication Instructions:  Take the Metoprolol Succinate 50mg  one day, 25mg  (half tablet) the next day. Do this for 1 week   Continue to monitor your heart rate.  If your heart rate is still reading in the 40's, take the Metoprolol Succinate only one half pill a day only.  *If you need a refill on your cardiac medications before your next appointment, please call your pharmacy*   Lab Work: You will return in March of 2025 for fasting labs CMET, LIPID, CBC, TSH If you have labs (blood work) drawn today and your tests are completely normal, you will receive your results only by: MyChart Message (if you have MyChart) OR A paper copy in the mail If you have any lab test that is abnormal or we need to change your treatment, we will call you to review the results.   Testing/Procedures:  none   Follow-Up: At [facility], you and your health needs are our priority.  As part of our continuing mission to provide you with exceptional heart care, we have created designated Provider Care Teams.  These Care Teams include your primary Cardiologist (physician) and Advanced Practice Providers (APPs -  Physician Assistants and Nurse Practitioners) who all work together to provide you with the care you need, when you need it.  We recommend signing up for the patient portal called "MyChart".  Sign up information is provided on this After Visit Summary.  MyChart is used to connect with patients for Virtual Visits (Telemedicine).  Patients are able to view lab/test results, encounter notes, upcoming appointments, etc.  Non-urgent messages can be  sent to your provider as well.   To learn more about what  you can do with MyChart, go to [email or web address].    Your next appointment:   6 month(s)  Provider:   [name], MD       Signed, [name], MD  09/04/2023 12:44 PM    [facility] [address], [place], [place]  [address] Phone: [phone]

## 2023-09-04 ENCOUNTER — Encounter: Payer: Self-pay | Admitting: Cardiovascular Disease

## 2023-09-12 ENCOUNTER — Other Ambulatory Visit: Payer: Self-pay | Admitting: Cardiovascular Disease

## 2023-09-12 DIAGNOSIS — I2510 Atherosclerotic heart disease of native coronary artery without angina pectoris: Secondary | ICD-10-CM

## 2023-12-07 ENCOUNTER — Encounter: Payer: Self-pay | Admitting: Cardiovascular Disease

## 2023-12-10 NOTE — Telephone Encounter (Signed)
Called pt to discuss symptoms and medication. Left voicemail

## 2024-01-17 ENCOUNTER — Other Ambulatory Visit: Payer: Self-pay | Admitting: Cardiovascular Disease

## 2024-02-16 ENCOUNTER — Telehealth: Payer: Self-pay | Admitting: Cardiovascular Disease

## 2024-02-16 DIAGNOSIS — E875 Hyperkalemia: Secondary | ICD-10-CM

## 2024-02-16 DIAGNOSIS — R7989 Other specified abnormal findings of blood chemistry: Secondary | ICD-10-CM

## 2024-02-16 LAB — COMPREHENSIVE METABOLIC PANEL
ALT: 20 IU/L (ref 0–44)
AST: 21 IU/L (ref 0–40)
Albumin: 4.4 g/dL (ref 3.8–4.8)
Alkaline Phosphatase: 107 IU/L (ref 44–121)
BUN/Creatinine Ratio: 11 (ref 10–24)
BUN: 32 mg/dL — ABNORMAL HIGH (ref 8–27)
Bilirubin Total: 0.5 mg/dL (ref 0.0–1.2)
CO2: 21 mmol/L (ref 20–29)
Calcium: 9.2 mg/dL (ref 8.6–10.2)
Chloride: 106 mmol/L (ref 96–106)
Creatinine, Ser: 2.79 mg/dL — ABNORMAL HIGH (ref 0.76–1.27)
Globulin, Total: 2.3 g/dL (ref 1.5–4.5)
Glucose: 149 mg/dL — ABNORMAL HIGH (ref 70–99)
Potassium: 6.5 mmol/L (ref 3.5–5.2)
Sodium: 141 mmol/L (ref 134–144)
Total Protein: 6.7 g/dL (ref 6.0–8.5)
eGFR: 23 mL/min/{1.73_m2} — ABNORMAL LOW (ref >59)

## 2024-02-16 LAB — CBC
Hematocrit: 34.8 % — ABNORMAL LOW (ref 37.5–51.0)
Hemoglobin: 11.5 g/dL — ABNORMAL LOW (ref 13.0–17.7)
MCH: 32.4 pg (ref 26.6–33.0)
MCHC: 33 g/dL (ref 31.5–35.7)
MCV: 98 fL — ABNORMAL HIGH (ref 79–97)
Platelets: 247 10*3/uL (ref 150–450)
RBC: 3.55 x10E6/uL — ABNORMAL LOW (ref 4.14–5.80)
RDW: 12.9 % (ref 11.6–15.4)
WBC: 7.1 10*3/uL (ref 3.4–10.8)

## 2024-02-16 LAB — TSH: TSH: 0.803 u[IU]/mL (ref 0.450–4.500)

## 2024-02-16 LAB — LIPID PANEL
Chol/HDL Ratio: 3.1 ratio (ref 0.0–5.0)
Cholesterol, Total: 77 mg/dL — ABNORMAL LOW (ref 100–199)
HDL: 25 mg/dL — ABNORMAL LOW (ref >39)
LDL Chol Calc (NIH): 27 mg/dL (ref 0–99)
Triglycerides: 143 mg/dL (ref 0–149)
VLDL Cholesterol Cal: 25 mg/dL (ref 5–40)

## 2024-02-16 NOTE — Telephone Encounter (Signed)
 Called pt and sent mychart message. Waiting for response so that we may discuss labs

## 2024-02-16 NOTE — Telephone Encounter (Signed)
 Patient identification verified by 2 forms. Shade Flood, RN    [facility] called to report critical lab result of POTASSIUM 6.5 from patient's CMET on 02/15/24.    Called and spoke to patient  Patient states:  - "I Feel fine right now"   Patient denies:  - chest pain, irregular HR, dizziness, muscle weakness, SOB at time of call.              Interventions/Plan: - Lab results reviewed with primary cardiologist [name]. Per Dr. Tresa Endo  Patient to avoid foods high in potassium until Monday 02/21/24. Patient will return on Monday for repeat BMET. Amb Ref to Nephrology placed today as well.   Patient agrees with plan, no questions at this time

## 2024-02-16 NOTE — Telephone Encounter (Signed)
 Stat lab results

## 2024-02-16 NOTE — Addendum Note (Signed)
 Addended by: Shon Hale on: 02/16/2024 10:41 AM   Modules accepted: Orders

## 2024-02-16 NOTE — Telephone Encounter (Signed)
 Spoke to patients wife. He will be in on Monday to have repeat labs done. Wife states pt has been eating canned soups for dinner which has lots of potassium in them. Wife is not able to stand long so there has not been lots of home cooked meals being prepared.

## 2024-02-21 ENCOUNTER — Telehealth: Payer: Self-pay | Admitting: Student in an Organized Health Care Education/Training Program

## 2024-02-21 ENCOUNTER — Other Ambulatory Visit: Payer: Self-pay

## 2024-02-21 DIAGNOSIS — E875 Hyperkalemia: Secondary | ICD-10-CM

## 2024-02-21 DIAGNOSIS — R7989 Other specified abnormal findings of blood chemistry: Secondary | ICD-10-CM

## 2024-02-21 NOTE — Telephone Encounter (Signed)
 I was paged by [facility] regarding a critical lab result for Dustin Goodwin. His K+ is 6.0. On chart review his K+ levels have been elevated for the past 11 months but worse in the past 6 days. I called the patient and his wife answered. I spoke to her regarding the patient's lab findings. She states that he has been trying to reduce his K+ intake without success. He's not on any K+ sparing medications. She does not feel comfortable with her current PCP. She is also unaware that his sCr is abnormal and has worsened since last few checks. I instructed Dustin Goodwin to bring Dustin Goodwin to the local ED [facility] for him) in the morning to get his K+ sorted out and renal failure assessed. I further instructed the patient to hold off on taking his usual gabapentin 300 mg dose in the setting of worsening renal failure. The patient's wife verbalized an understanding and will take him to the ED in the morning. The patient is currently asymptomatic.

## 2024-02-22 LAB — BASIC METABOLIC PANEL WITH GFR
BUN/Creatinine Ratio: 13 (ref 10–24)
BUN: 35 mg/dL — ABNORMAL HIGH (ref 8–27)
CO2: 18 mmol/L — ABNORMAL LOW (ref 20–29)
Calcium: 9.5 mg/dL (ref 8.6–10.2)
Chloride: 106 mmol/L (ref 96–106)
Creatinine, Ser: 2.77 mg/dL — ABNORMAL HIGH (ref 0.76–1.27)
Glucose: 105 mg/dL — ABNORMAL HIGH (ref 70–99)
Potassium: 6 mmol/L (ref 3.5–5.2)
Sodium: 141 mmol/L (ref 134–144)
eGFR: 23 mL/min/{1.73_m2} — ABNORMAL LOW (ref >59)

## 2024-02-22 LAB — LAB REPORT - SCANNED: EGFR: 25

## 2024-03-08 ENCOUNTER — Encounter: Payer: Self-pay | Admitting: Cardiovascular Disease

## 2024-03-14 LAB — COMPREHENSIVE METABOLIC PANEL WITH GFR
ALT: 14 IU/L (ref 0–44)
AST: 17 IU/L (ref 0–40)
Albumin: 4.8 g/dL (ref 3.8–4.8)
Alkaline Phosphatase: 92 IU/L (ref 44–121)
BUN/Creatinine Ratio: 14 (ref 10–24)
BUN: 36 mg/dL — ABNORMAL HIGH (ref 8–27)
Bilirubin Total: 0.6 mg/dL (ref 0.0–1.2)
CO2: 19 mmol/L — ABNORMAL LOW (ref 20–29)
Calcium: 9.7 mg/dL (ref 8.6–10.2)
Chloride: 104 mmol/L (ref 96–106)
Creatinine, Ser: 2.59 mg/dL — ABNORMAL HIGH (ref 0.76–1.27)
Globulin, Total: 2.4 g/dL (ref 1.5–4.5)
Glucose: 110 mg/dL — ABNORMAL HIGH (ref 70–99)
Potassium: 5.1 mmol/L (ref 3.5–5.2)
Sodium: 141 mmol/L (ref 134–144)
Total Protein: 7.2 g/dL (ref 6.0–8.5)
eGFR: 25 mL/min/{1.73_m2} — ABNORMAL LOW (ref >59)

## 2024-03-30 ENCOUNTER — Ambulatory Visit: Payer: Medicare Other | Attending: Cardiovascular Disease | Admitting: Cardiovascular Disease

## 2024-03-30 ENCOUNTER — Encounter: Payer: Self-pay | Admitting: Cardiovascular Disease

## 2024-03-30 VITALS — BP 148/68 | HR 58 | Ht 68.0 in | Wt 192.0 lb

## 2024-03-30 DIAGNOSIS — Z951 Presence of aortocoronary bypass graft: Secondary | ICD-10-CM | POA: Insufficient documentation

## 2024-03-30 DIAGNOSIS — E785 Hyperlipidemia, unspecified: Secondary | ICD-10-CM | POA: Insufficient documentation

## 2024-03-30 DIAGNOSIS — E119 Type 2 diabetes mellitus without complications: Secondary | ICD-10-CM | POA: Diagnosis present

## 2024-03-30 DIAGNOSIS — I2510 Atherosclerotic heart disease of native coronary artery without angina pectoris: Secondary | ICD-10-CM | POA: Diagnosis present

## 2024-03-30 DIAGNOSIS — N184 Chronic kidney disease, stage 4 (severe): Secondary | ICD-10-CM | POA: Insufficient documentation

## 2024-03-30 DIAGNOSIS — E7841 Elevated Lipoprotein(a): Secondary | ICD-10-CM | POA: Diagnosis present

## 2024-03-30 MED ORDER — NITROGLYCERIN 0.4 MG SL SUBL: 0.4000 mg | SUBLINGUAL_TABLET | SUBLINGUAL | 3 refills | Status: AC | PRN

## 2024-03-30 MED ORDER — HYDRALAZINE HCL 25 MG PO TABS: 12.5000 mg | ORAL_TABLET | Freq: Two times a day (BID) | ORAL | 3 refills | Status: DC

## 2024-03-30 NOTE — Progress Notes (Signed)
 Cardiology Office Note    Date:  04/01/2024   ID:  Dustin Goodwin, DOB 04/12/1951, MRN [id number]  PCP:  Atilano Blander, MD  Cardiologist:  Magnus Schuller, MD   7 month F/U evaluation  History of Present Illness:  Dustin Goodwin is a 73 y.o. male who is the husband of my patient Dustin Goodwin.  He lives in [place], [place] .  He has retired and works on his farm cutting wood and mowing grass.  He has a history of hypertension as well as a history of diabetes mellitus for greater than 10 years.  Approximately 1 year ago he had experienced some chest pain associated with shortness of breath and diaphoresis.  He has been followed at [facility] clinic in [place].  He subsequently had another episode which was relieved with nitroglycerin .  On August 27, 2021 he underwent a [place] Myoview study which raised the possibility of a small fixed defect inferiorly.  EF was 57% and there were no ECG changes.  He had an echo Doppler study on September 25, 2021 which showed an EF of 55 to 60% and he had normal wall motion.  Recently, he has experienced some recurrent episodes of chest discomfort and typically there may be 1 to 2 months without experiencing symptomatology.  His last episode occurred 1 week ago which was associated with chest pressure.  In the interim, he remains active and denies any exertional precipitation of discomfort.  Presently, he is on metoprolol  succinate 50 mg daily, aspirin  81 mg milligrams, and has been on simvastatin 10 mg daily for hyperlipidemia.  He is on glimepiride , metformin and Ozempic for diabetes.  He has a history of neuropathy on gabapentin.  With his recurrent chest pains symptomatology he wished to be seen by me and I saw him for my initial evaluation on Mar 26, 2022.  At his initial evaluation, his blood pressure was elevated.  He had been on metoprolol  succinate 50 mg daily and his ventricular rate was in the 60s.  I recommended follow-up laboratory with  a fasting lipid panel, TSH and LP(a).  I started him on amlodipine  5 mg both for blood pressure control and potential anti-ischemic benefit.  I recommended he undergo coronary CTA for further evaluation of his chest pain, and evaluate for coronary calcification and potential luminal stenosis.  I saw him on April 27, 2022.  At that time he denied any recurrent chest pain since instituting amlodipine  and his blood pressure has improved.  He underwent coronary CTA on Apr 13, 2022 which revealed an elevated calcium  score at 1283, representing 49 percentile for age, sex and race matched controls.  He was felt to have severe stenosis in the proximal and mid LAD in the range of 70 to 99% and severe stenosis in the distal circumflex and a range of 70 and 99% and OM 2 vessel.  There was mild 25 to 49% stenosis in the mid RCA.  Subsequent FFR analysis was performed which is positive for hemodynamic significance in the LAD at 0.56, distal LAD less than 0.50, the distal circumflex was felt possibly to be occluded.  OM 2 vessel was 0.71.    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  Due to his diffuse multivessel calcified disease CABG revascularization was recommended.    He underwent CABG x 4 on June 03, 2022 by Dr. Deloise Ferries and had a LIMA placed to his LAD, SVG to the  first and third marginal and PDA.  He developed a brief episode of SVT in the early morning postop day 1 treated with IV metoprolol .  He developed AF with RVR on postop day 2.  He was loaded with IV amniodarone.  He did have slight increase in serum creatinine.  He was subsequently evaluated by Lawana Pray, NP on June 24, 2022 and felt well.  I saw him for my initial postoperative evaluation on December 03, 2022.  He has just completed cardiac rehab in [place] following 8 weeks.  He feels significantly stronger.  He denies chest pain.  He is unaware of recurrent atrial  fibrillation.  He has continued to be on reduced dose of amiodarone  at 200 mg daily.  He is on atorvastatin  80 mg.  LP(a) was increased at 147.  His blood pressure had gotten low and he now is on reduced dose of amlodipine  at 2.5 mg in addition to his metoprolol  succinate 50 mg.  He is diabetic on glimepiride  and metformin.  He has been taking over-the-counter fish oil.  During that evaluation, he was maintaining sinus rhythm.  I recommended he reduce his amiodarone  from 20 mg down to 100 mg for 2 months and then reduce to every other day.  Follow-up laboratory was also recommended with follow-up echocardiographic evaluation.  He underwent an echo Doppler study on December 04, 2022.  This showed normal systolic function with EF 60 to 65% and normal wall motion.  There was mild to moderate left atrial dilatation and mild aortic sclerosis.    I saw him on March 02, 2023 at which time he felt well and denied chest pain or shortness of breath. He has CKD with creatinine in January 2024 at 2.46.  He is now off amiodarone .  He continues to be on amlodipine  2.5 mg, metoprolol  succinate 50 mg daily for CAD and hypertension.  He is diabetic on glimepiride  and metformin.  He continues to be on atorvastatin  80 mg for hyperlipidemia.  He was unaware of any recurrent palpitations or shortness of breath.  He presents for evaluation.  He is followed by Dr. Rankin Buzzard for primary care.  I last saw him on September 01, 2023 at which time he continued to feel well and denied any chest pain or shortness of breath.  H.  He remains active.  He denies any dizziness or lightheadedness.  Heart rate has tended to be low.  He continues to be on amiodarone  100 mg daily, amlodipine  2.5 mg, isosorbide  30 mg, metoprolol  succinate 50 mg daily.  He is diabetic on metformin.  He has neuropathy on gabapentin.  Currently he is on atorvastatin  80 mg daily for hyperlipidemia.  His ECG showed sinus bradycardia at 44.  At that time, he was supposed to be  off amiodarone  but apparently was still taking low-dose.  I recommended further reduction of metoprolol  succinate from 50 mg daily down to 50 mg alternating with 25 mg every other day.  Since I last saw him, he has felt well.  He believes he is off amiodarone  presently but it was still on his list.  He is on amlodipine  2.5 mg daily, isosorbide  30 mg, and is now taking metoprolol  at just 25 mg daily.  He continues to be on atorvastatin  80 mg for hyperlipidemia in addition to over-the-counter fish oil and is diabetic on glimepiride .  He could continues to be followed at [facility] in [place] by Dr. Rankin Buzzard.  When recently seen blood pressure was elevated.  Laboratory on Mar 27, 2024 showed hemoglobin A1c of 6.4.  Creatinine was 2.47 with estimated GFR 26 consistent with stage IV CKD.  Potassium is minimally increased at 5.3.  He presents for evaluation.  Past medical history is notable for hypertension, type 2 diabetes mellitus, peripheral neuropathy and hyperlipidemia.  Surgical history is notable for leg and back surgery following a severe fall where he had fallen 50 feet resulting in significant injury.  He also status post hernia surgery.  Current Medications: Outpatient Medications Prior to Visit  Medication Sig Dispense Refill   amLODipine  (NORVASC ) 2.5 MG tablet TAKE 1 TABLET BY MOUTH EVERY DAY 90 tablet 3   aspirin  81 MG chewable tablet Chew 81 mg by mouth daily.     atorvastatin  (LIPITOR ) 80 MG tablet TAKE 1 TABLET BY MOUTH EVERY DAY 90 tablet 1   Coenzyme Q10 200 MG capsule Take 200 mg by mouth daily.     glimepiride  (AMARYL ) 4 MG tablet Take 2 mg by mouth daily.     HYDROcodone-acetaminophen  (NORCO) 7.5-325 MG tablet Take 1 tablet by mouth every 6 (six) hours as needed for moderate pain or severe pain.     isosorbide  mononitrate (IMDUR ) 30 MG 24 hr tablet Take 30 mg by mouth daily.     metoprolol  succinate (TOPROL -XL) 50 MG 24 hr tablet Take 25 mg by mouth daily.     Omega-3 Fatty  Acids (FISH OIL PO) Take 1,500 mg by mouth daily.     amiodarone  (PACERONE ) 200 MG tablet Take 0.5 tablets (100 mg total) by mouth daily. 45 tablet 3   nitroGLYCERIN  (NITROSTAT ) 0.4 MG SL tablet Place 0.4 mg under the tongue every 5 (five) minutes as needed for chest pain.     amLODipine  (NORVASC ) 5 MG tablet TAKE 1 TABLET (5 MG TOTAL) BY MOUTH DAILY. 90 tablet 1   gabapentin (NEURONTIN) 300 MG capsule Take 300 mg by mouth 3 (three) times daily.     metFORMIN (GLUCOPHAGE-XR) 750 MG 24 hr tablet Take 750 mg by mouth daily with breakfast.     No facility-administered medications prior to visit.     Allergies:   Patient has no known allergies.   Social History   Socioeconomic History   Marital status: Married    Spouse name: Not on file   Number of children: Not on file   Years of education: Not on file   Highest education level: Not on file  Occupational History   Not on file  Tobacco Use   Smoking status: Never   Smokeless tobacco: Never  Vaping Use   Vaping status: Never Used  Substance and Sexual Activity   Alcohol use: Never   Drug use: Never   Sexual activity: Not on file  Other Topics Concern   Not on file  Social History Narrative   Not on file   Social Drivers of Health   Financial Resource Strain: Not on file  Food Insecurity: Not on file  Transportation Needs: Not on file  Physical Activity: Not on file  Stress: Not on file  Social Connections: Not on file    Social history is notable that he was born in [place] .  He has been married for 46 years.  He retired at age 27 from [employer] which [employer].  There is no tobacco alcohol history.  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  He has 2 living brothers ages 66 and 85 both with diabetes.  He has a sister age 55.  ROS General: Negative; No fevers, chills,  or night sweats;  HEENT: Negative; No changes in vision or hearing, sinus congestion, difficulty swallowing Pulmonary: Negative; No cough, wheezing, shortness of breath, hemoptysis Cardiovascular: See HPI GI: Negative; No nausea, vomiting, diarrhea, or abdominal pain GU: Negative; No dysuria, hematuria, or difficulty voiding Musculoskeletal: Remote fall leading to back and leg issues Hematologic/Oncology: Negative; no easy bruising, bleeding Endocrine: Negative; no heat/cold intolerance; no diabetes Neuro: Negative; no changes in balance, headaches Skin: Negative; No rashes or skin lesions Psychiatric: Negative; No behavioral problems, depression Sleep: Negative; No snoring, daytime sleepiness, hypersomnolence, bruxism, restless legs, hypnogognic hallucinations, no cataplexy Other comprehensive 14 point system review is negative.   PHYSICAL EXAM:   VS:  BP (!) 148/68 (BP Location: Left Arm, Patient Position: Sitting, Cuff Size: Normal)   Pulse (!) 58   Ht 5\' 8"  (1.727 m)   Wt 192 lb (87.1 kg)   SpO2 96%   BMI 29.19 kg/m     Repeat blood pressure by me was 146/72  Wt Readings from Last 3 Encounters:  03/30/24 192 lb (87.1 kg)  09/01/23 192 lb 3.2 oz (87.2 kg)  03/02/23 183 lb (83 kg)   General: Alert, oriented, no distress.  Skin: normal turgor, no rashes, warm and dry HEENT: Normocephalic, atraumatic. Pupils equal round and reactive to light; sclera anicteric; extraocular muscles intact;  Nose without nasal septal hypertrophy Mouth/Parynx benign; Mallinpatti scale 3 Neck: No JVD, no carotid bruits; normal carotid upstroke Lungs: clear to ausculatation and percussion; no wheezing or rales Chest wall: without tenderness to palpitation Heart: PMI not displaced, RRR, s1 s2 normal, 1/6 systolic murmur, no diastolic murmur, no rubs, gallops, thrills, or heaves Abdomen: soft, nontender; no hepatosplenomehaly, BS+; abdominal aorta nontender and not dilated by palpation. Back: no CVA  tenderness Pulses 2+ Musculoskeletal: full range of motion, normal strength, no joint deformities Extremities: no clubbing cyanosis or edema, Homan's sign negative  Neurologic: grossly nonfocal; Cranial nerves grossly wnl Psychologic: Normal mood and affect     Studies/Labs Reviewed:   EKG Interpretation Date/Time:  Thursday Mar 30 2024 11:49:24 EDT Ventricular Rate:  51 PR Interval:  186 QRS Duration:  92 QT Interval:  420 QTC Calculation: 387 R Axis:   45  Text Interpretation: Sinus bradycardia When compared with ECG of 01-Sep-2023 11:52, T wave amplitude has decreased in Anterior leads Confirmed by Magnus Schuller ([id number]) on 03/30/2024 12:33:51 PM   EKG Interpretation Date/Time:  Thursday Mar 30 2024 11:49:24 EDT Ventricular Rate:  51 PR Interval:  186 QRS Duration:  92 QT Interval:  420 QTC Calculation: 387 R Axis:   45  Text Interpretation: Sinus bradycardia When compared with ECG of 01-Sep-2023 11:52, T wave amplitude has decreased in Anterior leads Confirmed by Magnus Schuller ([id number]) on 03/30/2024 12:33:51 PM   September 01, 2023 ECG (independently read by me): Sinus bradycardia at 44, 1st degree heart block  March 02, 2023 ECG (independently read by me): Sinus bradycardia at 53, no ectopy.  Normal intervals  December 03, 2022 ECG (independently read by me): NSR at 66, no ST changes, no ectopy  April 27, 2022 ECG (independently read by me): Sinus bradycardia at 59, no ST changes  Mar 26, 2022 ECG (independently read by me):  NSR at 68, no STT changes, no ectopy, normal intervals  Recent Labs: He brought with him recent laboratory from  Dr. Atilano Blander from March 20, 2022. Hemoglobin 14.9/hematocrit 43.4/platelets 229,000 Potassium 4.1, creatinine 1.32.  Glucose 155, AST 26, ALT 24.     Latest Ref Rng & Units 03/14/2024    9:49 AM 02/21/2024    9:02 AM 02/15/2024    9:19 AM  BMP  Glucose 70 - 99 mg/dL 132  440  102   BUN 8 - 27 mg/dL 36  35  32   Creatinine 0.76 - 1.27  mg/dL 7.25  3.66  4.40   BUN/Creat Ratio 10 - 24 14  13  11    Sodium 134 - 144 mmol/L 141  141  141   Potassium 3.5 - 5.2 mmol/L 5.1  6.0  6.5   Chloride 96 - 106 mmol/L 104  106  106   CO2 20 - 29 mmol/L 19  18  21    Calcium  8.6 - 10.2 mg/dL 9.7  9.5  9.2         Latest Ref Rng & Units 03/14/2024    9:49 AM 02/15/2024    9:19 AM 12/04/2022    9:36 AM  Hepatic Function  Total Protein 6.0 - 8.5 g/dL 7.2  6.7  6.5   Albumin  3.8 - 4.8 g/dL 4.8  4.4  4.6   AST 0 - 40 IU/L 17  21  16    ALT 0 - 44 IU/L 14  20  15    Alk Phosphatase 44 - 121 IU/L 92  107  89   Total Bilirubin 0.0 - 1.2 mg/dL 0.6  0.5  0.4        Latest Ref Rng & Units 02/15/2024    9:19 AM 12/04/2022    9:36 AM 06/08/2022    2:56 AM  CBC  WBC 3.4 - 10.8 x10E3/uL 7.1  5.9  7.0   Hemoglobin 13.0 - 17.7 g/dL 34.7  9.8  42.5   Hematocrit 37.5 - 51.0 % 34.8  30.1  30.1   Platelets 150 - 450 x10E3/uL 247  216  264    Lab Results  Component Value Date   MCV 98 (H) 02/15/2024   MCV 100 (H) 12/04/2022   MCV 96.5 06/08/2022   Lab Results  Component Value Date   TSH 0.803 02/15/2024   Lab Results  Component Value Date   HGBA1C 6.6 (H) 05/29/2022     BNP No results found for: "BNP"  ProBNP No results found for: "PROBNP"   Lipid Panel     Component Value Date/Time   CHOL 77 (L) 02/15/2024 0919   TRIG 143 02/15/2024 0919   HDL 25 (L) 02/15/2024 0919   CHOLHDL 3.1 02/15/2024 0919   LDLCALC 27 02/15/2024 0919   LABVLDL 25 02/15/2024 0919     RADIOLOGY: No results found.   Additional studies/ records that were reviewed today include:  I reviewed the records of Dr. Atilano Blander, as well as [facility] clinic as well as his nuclear medicine stress test and 2D echo Doppler evaluations.  Laboratory was reviewed.  ASSESSMENT:    1. Coronary artery disease involving native coronary artery of native heart without angina pectoris   2. S/P CABG x 4   3. Hyperlipidemia with target LDL less than 50   4.  Elevated Lp(a)   5. Chronic kidney disease (CKD), stage IV (severe) (HCC)   6. Type 2 diabetes mellitus without complication, without long-term current use of insulin  [facility])     PLAN:  Mr. Bela Sokoloff is a 73 year old gentleman who is the wife  of one of my longstanding patients.  When I saw him for my initial evaluation on Mar 26, 2022 he admitted to at least a 10-year history of diabetes mellitus as well as hypertension.   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  He experienced several episodes of chest discomfort associated with shortness of breath and diaphoresis.  Initially this had occurred while walking up a steep hill.  He has undergone prior evaluation with a [place] Myoview study which was low risk but suggested possible small fixed defect inferiorly with EF 57%.  A  2D echo Doppler study has shown normal LV function with normal wall motion.  He had started  to experience some recurrent chest pain symptomatology and had episodes which would occur at 1 to 61-month intervals concerning for angina.  He was on metoprolol  succinate 50 mg and with mild blood pressure elevation and potential underlying ischemia I added amlodipine  5 mg.  He has not had any recurrent chest pain since initiation of amlodipine .  I obtained follow-up laboratory which revealed an excellent LDL cholesterol at 33 however LP(a) was significantly increased at 147.8.  I  recommended that the patient initiate aspirin  therapy.  He had normal TSH level.  His coronary CTA revealed an elevated calcium  score at 1283 and raised concern for hemodynamic significance of LAD circumflex and OM 2 lesions.  Cardiac catheterization on May 06, 2022 revealed severe multivessel CAD as noted in above cardiac catheterization report.  He underwent CABG revascularization surgery by Dr. Deloise Ferries on June 03, 2022 with a LIMA placed to his LAD, sequential vein graft to his OM1 and OM 3, and a vein graft to  his PDA.  Postoperative course was complicated by early SVT as well as AF with RVR for which she was loaded with amiodarone .  When seen by Lawana Pray in August 2023 he was advised additional taper of amiodarone . He underwent cardiac rehab in [place].  When I saw him in April 2024 he was off amiodarone  and maintaining sinus rhythm.  He has stage IV CKD with most recent creatinine on March 10, 2023 at 2.33 and estimated GFR at 36.  Laboratory in January showed a creatinine of 2.46.  Echocardiography on December 04, 2022 showed normal LV function with EF 60 to 65% with mild to moderate LA dilatation and aortic valve sclerosis.  At his October 2020 for evaluation, he apparently was still taking amiodarone  100 mg daily and ECG showed sinus bradycardia at 44 bpm.  During that evaluation I recommended further reduction of metoprolol  succinate.  Presently, he was somewhat confused as to his current medicines but I believe he is now off amiodarone  based on my last recommendation.  He continues to be on amlodipine  2.5 mg daily and has been taking metoprolol  succinate 25 mg daily in addition to isosorbide  30 mg.  He continues to be on atorvastatin  80 mg and over-the-counter fish oil for hyperlipidemia.  He is diabetic on glimepiride .  Blood pressure today is elevated.  He has stage IV CKD with estimated GFR at 26 with recent creatinine 2.47 when checked on Mar 27, 2024 in [place].  He continues to be on aspirin  with his CAD and elevated LP(a).  At times he admits to a rare chest flutter.  ECG today continues to show sinus bradycardia 51 bpm.  With slight blood pressure elevation I have recommended initiation of low-dose hydralazine  at 12.5 mg twice daily.  He is aware of  my upcoming retirement.  I will transition both he and his wife to the care of Dr. Carson Clara with plan follow-up in approximately 6 months or sooner as needed.   Medication Adjustments/Labs and Tests Ordered: Current medicines are  reviewed at length with the patient today.  Concerns regarding medicines are outlined above.  Medication changes, Labs and Tests ordered today are listed in the Patient Instructions below. Patient Instructions  Medication Instructions:  Your physician has recommended you make the following change in your medication:   START Hydralazine  25 mg taking 1/2 tablet twice a day  *If you need a refill on your cardiac medications before your next appointment, please call your pharmacy*  Lab Work: None ordered  If you have labs (blood work) drawn today and your tests are completely normal, you will receive your results only by: MyChart Message (if you have MyChart) OR A paper copy in the mail If you have any lab test that is abnormal or we need to change your treatment, we will call you to review the results.  Testing/Procedures: None ordered  Follow-Up: At [facility], you and your health needs are our priority.  As part of our continuing mission to provide you with exceptional heart care, our providers are all part of one team.  This team includes your primary Cardiologist (physician) and Advanced Practice Providers or APPs (Physician Assistants and Nurse Practitioners) who all work together to provide you with the care you need, when you need it.  Your next appointment:   6 month(s)  Provider:   Wendie Hamburg, MD    We recommend signing up for the patient portal called "MyChart".  Sign up information is provided on this After Visit Summary.  MyChart is used to connect with patients for Virtual Visits (Telemedicine).  Patients are able to view lab/test results, encounter notes, upcoming appointments, etc.  Non-urgent messages can be sent to your provider as well.   To learn more about what you can do with MyChart, go to [email or web address].   Other Instructions        Signed, Magnus Schuller, MD  04/01/2024 9:46 AM    [facility] [address], [place], [place]  [address] Phone: [phone]

## 2024-03-30 NOTE — Patient Instructions (Signed)
 Medication Instructions:  Your physician has recommended you make the following change in your medication:   START Hydralazine  25 mg taking 1/2 tablet twice a day  *If you need a refill on your cardiac medications before your next appointment, please call your pharmacy*  Lab Work: None ordered  If you have labs (blood work) drawn today and your tests are completely normal, you will receive your results only by: MyChart Message (if you have MyChart) OR A paper copy in the mail If you have any lab test that is abnormal or we need to change your treatment, we will call you to review the results.  Testing/Procedures: None ordered  Follow-Up: At [facility], you and your health needs are our priority.  As part of our continuing mission to provide you with exceptional heart care, our providers are all part of one team.  This team includes your primary Cardiologist (physician) and Advanced Practice Providers or APPs (Physician Assistants and Nurse Practitioners) who all work together to provide you with the care you need, when you need it.  Your next appointment:   6 month(s)  Provider:   Wendie Hamburg, MD    We recommend signing up for the patient portal called "MyChart".  Sign up information is provided on this After Visit Summary.  MyChart is used to connect with patients for Virtual Visits (Telemedicine).  Patients are able to view lab/test results, encounter notes, upcoming appointments, etc.  Non-urgent messages can be sent to your provider as well.   To learn more about what you can do with MyChart, go to [email or web address].   Other Instructions

## 2024-04-01 ENCOUNTER — Encounter: Payer: Self-pay | Admitting: Cardiovascular Disease

## 2024-08-07 ENCOUNTER — Other Ambulatory Visit: Payer: Self-pay | Admitting: General Practice

## 2024-08-07 MED ORDER — ATORVASTATIN CALCIUM 80 MG PO TABS: 80.0000 mg | ORAL_TABLET | Freq: Every day | ORAL | 1 refills | Status: AC

## 2024-09-24 NOTE — Progress Notes (Unsigned)
 Cardiology Office Note:    Date:  09/26/2024   ID:  Dustin Goodwin, DOB 19-May-1951, MRN [id number]  PCP:  Hanna Fallow, MD  Cardiologist:  Lonni LITTIE Nanas, MD  Electrophysiologist:  None   Referring MD: Hanna Fallow, MD   Chief Complaint  Patient presents with   Coronary Artery Disease    History of Present Illness:    Dustin Goodwin is a 73 y.o. male with a hx of CAD, T2DM, hypertension, hyperlipidemia, stage IV CKD who presents for follow-up.  Previously followed with Dr. Burnard.  Coronary CTA 03/2022 showed severe stenosis in proximal and mid LAD, severe stenosis in distal LCx and OM 2, mild stenosis in the RCA, calcium  score 1283 (88th percentile).  LHC 05/06/2022 showed diffuse calcified LAD stenosis with 95% ostial stenosis of a small D1 branch, 80 and 90% proximal to mid and mid diffuse LAD stenosis, 80 and 90% stenosis of D2, 95% distal LCx stenosis, 75% proximal PDA stenosis.  Echocardiogram 04/2022 showed EF 60 to 65%, low normal RV function, no significant valvular disease.  He underwent CABG x 4 (LIMA-LAD, SVG-OM1, SVG-OM 3, SVG-PDA) on 06/03/2022.  Echocardiogram 12/04/2022 showed EF 60 to 65%, mild RV dysfunction, mild to moderate left atrial enlargement.  Since last clinic visit, he reports he is doing well.  Denies any chest pain, dyspnea, lightheadedness, syncope, lower extremity edema.  Does report having palpitations, about once per month, can last 30 to 60 minutes.  Feels like heart is racing during episodes.  Main issue is he feels tired all the time.  Reports he can walk up a flight of stairs without stopping, denies any exertional chest pain or dyspnea.    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    Past Surgical History:  Procedure Laterality Date   ANKLE SURGERY Right 1978   fell from  lift-leg and ankle surgery   BACK SURGERY  2017   CORONARY ARTERY BYPASS GRAFT N/A 06/03/2022   Procedure: CORONARY ARTERY BYPASS GRAFTING (CABG) TIMES 4, USING LEFT INTERNAL MAMMARY ARTERY AND GREATER SAPHENOUS VEIN;  Surgeon: Shyrl Linnie KIDD, MD;  Location: [facility] OR;  Service: Open Heart Surgery;  Laterality: N/A;   FOOT SURGERY Right 1963   HERNIA REPAIR     umbilical and inguinal   LEFT HEART CATH AND CORONARY ANGIOGRAPHY N/A 05/06/2022   Procedure: LEFT HEART CATH AND CORONARY ANGIOGRAPHY;  Surgeon: Burnard Debby LABOR, MD;  Location: [facility] INVASIVE CV LAB;  Service: Cardiovascular;  Laterality: N/A;   LEG SURGERY Right 1978   fell from a lift-skin graft needed   TEE WITHOUT CARDIOVERSION N/A 06/03/2022   Procedure: TRANSESOPHAGEAL ECHOCARDIOGRAM (TEE);  Surgeon: Shyrl Linnie KIDD, MD;  Location: [facility] OR;  Service: Open Heart Surgery;  Laterality: N/A;    Current Medications: Current Meds  Medication Sig   amLODipine  (NORVASC ) 2.5 MG tablet TAKE 1 TABLET BY MOUTH EVERY DAY   atorvastatin  (LIPITOR ) 80 MG tablet Take 1 tablet (80 mg total) by mouth daily.   Coenzyme Q10 200 MG capsule Take 200 mg by mouth daily.   glimepiride  (AMARYL ) 4 MG tablet Take 2 mg by mouth daily.   HYDROcodone-acetaminophen  (NORCO) 7.5-325 MG tablet Take 1 tablet by mouth every 6 (six) hours as needed for moderate pain or severe pain.   isosorbide  mononitrate (IMDUR )  30 MG 24 hr tablet Take 30 mg by mouth daily.   metoprolol  succinate (TOPROL -XL) 50 MG 24 hr tablet Take 25 mg by mouth daily.   nitroGLYCERIN  (NITROSTAT ) 0.4 MG SL tablet Place 1 tablet (0.4 mg total) under the tongue every 5 (five) minutes as needed for chest pain.   Omega-3 Fatty Acids (FISH OIL PO) Take 1,500 mg by mouth daily.   [DISCONTINUED] hydrALAZINE  (APRESOLINE ) 25 MG tablet Take 0.5 tablets (12.5 mg total) by mouth in the morning and at bedtime.     Allergies:   Patient has no known allergies.   Social History   Socioeconomic History    Marital status: Married    Spouse name: Not on file   Number of children: Not on file   Years of education: Not on file   Highest education level: Not on file  Occupational History   Not on file  Tobacco Use   Smoking status: Never   Smokeless tobacco: Never  Vaping Use   Vaping status: Never Used  Substance and Sexual Activity   Alcohol use: Never   Drug use: Never   Sexual activity: Not on file  Other Topics Concern   Not on file  Social History Narrative   Not on file   Social Drivers of Health   Financial Resource Strain: Not on file  Food Insecurity: Not on file  Transportation Needs: Not on file  Physical Activity: Not on file  Stress: Not on file  Social Connections: Not on file     Family History: The patient's family history is not on file.  ROS:   Please see the history of present illness.     All other systems reviewed and are negative.  EKGs/Labs/Other Studies Reviewed:    The following studies were reviewed today:   EKG:   09/26/2024: Normal sinus rhythm, rate 61, no ST abnormalities  Recent Labs: 02/15/2024: Hemoglobin 11.5; Platelets 247; TSH 0.803 03/14/2024: ALT 14; BUN 36; Creatinine, Ser 2.59; Potassium 5.1; Sodium 141  Recent Lipid Panel    Component Value Date/Time   CHOL 77 (L) 02/15/2024 0919   TRIG 143 02/15/2024 0919   HDL 25 (L) 02/15/2024 0919   CHOLHDL 3.1 02/15/2024 0919   LDLCALC 27 02/15/2024 0919    Physical Exam:    VS:  BP (!) 122/52 (BP Location: Left Arm, Patient Position: Sitting)   Pulse 61   Resp 16   Ht 5' 10 (1.778 m)   Wt 188 lb (85.3 kg)   SpO2 92%   BMI 26.98 kg/m     Wt Readings from Last 3 Encounters:  09/26/24 188 lb (85.3 kg)  03/30/24 192 lb (87.1 kg)  09/01/23 192 lb 3.2 oz (87.2 kg)     GEN:  Well nourished, well developed in no acute distress HEENT: Normal NECK: No JVD; No carotid bruits LYMPHATICS: No lymphadenopathy CARDIAC: RRR, no murmurs, rubs, gallops RESPIRATORY:  Clear to  auscultation without rales, wheezing or rhonchi  ABDOMEN: Soft, non-tender, non-distended MUSCULOSKELETAL:  No edema; No deformity  SKIN: Warm and dry NEUROLOGIC:  Alert and oriented x 3 PSYCHIATRIC:  Normal affect   ASSESSMENT:    1. Coronary artery disease involving native coronary artery of native heart without angina pectoris   2. S/P CABG x 4   3. Chronic kidney disease (CKD), stage IV (severe) (HCC)   4. Palpitations   5. Pre-op evaluation   6. PAF (paroxysmal atrial fibrillation) (HCC)   7. Essential hypertension  PLAN:    CAD: Coronary CTA 03/2022 showed severe stenosis in proximal and mid LAD, severe stenosis in distal LCx and OM 2, mild stenosis in the RCA, calcium  score 1283 (88th percentile).  LHC 05/06/2022 showed diffuse calcified LAD stenosis with 95% ostial stenosis of a small D1 branch, 80 and 90% proximal to mid and mid diffuse LAD stenosis, 80 and 90% stenosis of D2, 95% distal LCx stenosis, 75% proximal PDA stenosis.  Echocardiogram 04/2022 showed EF 60 to 65%, low normal RV function, no significant valvular disease.  He underwent CABG x 4 (LIMA-LAD, SVG-OM1, SVG-OM 3, SVG-PDA) on 06/03/2022.  Echocardiogram 12/04/2022 showed EF 60 to 65%, mild RV dysfunction, mild to moderate left atrial enlargement.  Denies anginal symptoms - Continue aspirin  81 mg daily - Continue atorvastatin  80 mg daily - Continue Toprol -XL 25 mg daily  Preop evaluation: Planning cataract surgery.  Denies any anginal symptoms.  Low risk surgery, no further cardiac workup recommended.  Would continue aspirin  perioperatively  Paroxysmal atrial fibrillation: Postoperative course after CABG 05/2022 complicated by A-fib, previously on amiodarone  but was discontinued - Has not had known recurrence but is reporting palpitations.  Recommend Zio patch x 2 weeks for further evaluation  Hypertension: On hydralazine  12.5 mg twice daily, Imdur  30 mg daily, Toprol -XL 25 mg daily, amlodipine  2.5 mg daily.  Low  diastolic BP in clinic today and he is reporting feeling very fatigued, suspect may be related to soft BP.  Will discontinue hydralazine .  Asked to check BP twice daily for next week and let us  know results  Hyperlipidemia: On atorvastatin  80 mg daily.  LDL 27 on 02/15/24  Stage IV CKD: Cr 2.59 on 03/14/24.  Follows with nephrology.  Check BMET  T2DM: On glimepiride   RTC in 3 months    Medication Adjustments/Labs and Tests Ordered: Current medicines are reviewed at length with the patient today.  Concerns regarding medicines are outlined above.  Orders Placed This Encounter  Procedures   For home use only DME Other see comment   Basic metabolic panel with GFR   LONG TERM MONITOR (3-14 DAYS)   EKG 12-Lead   No orders of the defined types were placed in this encounter.   Patient Instructions  Medication Instructions:  STOP hydralazine  *If you need a refill on your cardiac medications before your next appointment, please call your pharmacy*  Lab Work: BMET today at [facility] If you have labs (blood work) drawn today and your tests are completely normal, you will receive your results only by: MyChart Message (if you have MyChart) OR A paper copy in the mail If you have any lab test that is abnormal or we need to change your treatment, we will call you to review the results.  Testing/Procedures: 14 Day Zio Heart Monitor Your physician has requested that you wear a Zio heart monitor for __14___ days. This will be mailed to your home with instructions on how to apply the monitor and how to return it when finished. Please allow 2 weeks after returning the heart monitor before our office calls you with the results.   Follow-Up: At [facility], you and your health needs are our priority.  As part of our continuing mission to provide you with exceptional heart care, our providers are all part of one team.  This team includes your primary Cardiologist (physician) and Advanced  Practice Providers or APPs (Physician Assistants and Nurse Practitioners) who all work together to provide you with the care you need, when you  need it.  Your next appointment:   3 month(s)  Provider:   Lonni LITTIE Nanas, MD    We recommend signing up for the patient portal called MyChart.  Sign up information is provided on this After Visit Summary.  MyChart is used to connect with patients for Virtual Visits (Telemedicine).  Patients are able to view lab/test results, encounter notes, upcoming appointments, etc.  Non-urgent messages can be sent to your provider as well.   To learn more about what you can do with MyChart, go to [email or web address].   Other Instructions BP Diary: take BP twice daily for 1 week and send us  the readings via MyChart.  ZIO XT- Long Term Monitor Instructions  Your physician has requested you wear a ZIO patch monitor for 14 days.  This is a single patch monitor. [employer] supplies one patch monitor per enrollment. Additional stickers are not available. Please do not apply patch if you will be having a Nuclear Stress Test,  Echocardiogram, Cardiac CT, MRI, or Chest Xray during the period you would be wearing the  monitor. The patch cannot be worn during these tests. You cannot remove and re-apply the  ZIO XT patch monitor.  Your ZIO patch monitor will be mailed 3 day USPS to your address on file. It may take 3-5 days  to receive your monitor after you have been enrolled.  Once you have received your monitor, please review the enclosed instructions. Your monitor  has already been registered assigning a specific monitor serial # to you.  Billing and Patient Assistance Program Information  We have supplied [employer] with any of your insurance information on file for billing purposes. [employer] offers a sliding scale Patient Assistance Program for patients that do not have  insurance, or whose insurance does not completely cover the cost of the ZIO monitor.   You must apply for the Patient Assistance Program to qualify for this discounted rate.  To apply, please call [employer] at [phone], select option 4, select option 2, ask to apply for  Patient Assistance Program. Meredeth will ask your household income, and how many people  are in your household. They will quote your out-of-pocket cost based on that information.  [employer] will also be able to set up a 53-month, interest-free payment plan if needed.  Applying the monitor   Shave hair from upper left chest.  Hold abrader disc by orange tab. Rub abrader in 40 strokes over the upper left chest as  indicated in your monitor instructions.  Clean area with 4 enclosed alcohol pads. Let dry.  Apply patch as indicated in monitor instructions. Patch will be placed under collarbone on left  side of chest with arrow pointing upward.  Rub patch adhesive wings for 2 minutes. Remove white label marked 1. Remove the white  label marked 2. Rub patch adhesive wings for 2 additional minutes.  While looking in a mirror, press and release button in center of patch. A small green light will  flash 3-4 times. This will be your only indicator that the monitor has been turned on.  Do not shower for the first 24 hours. You may shower after the first 24 hours.  Press the button if you feel a symptom. You will hear a small click. Record Date, Time and  Symptom in the Patient Logbook.  When you are ready to remove the patch, follow instructions on the last 2 pages of Patient  Logbook. Stick patch monitor onto the last page of Patient Logbook.  Place Patient Logbook in the blue and white box. Use locking tab on box and tape box closed  securely. The blue and white box has prepaid postage on it. Please place it in the mailbox as  soon as possible. Your physician should have your test results approximately 7 days after the  monitor has been mailed back to [facility].  Call [facility] Customer Care at  [phone] if you have questions regarding  your ZIO XT patch monitor. Call them immediately if you see an orange light blinking on your  monitor.  If your monitor falls off in less than 4 days, contact our Monitor department at [phone].  If your monitor becomes loose or falls off after 4 days call [employer] at [phone] for  suggestions on securing your monitor           Signed, Lonni LITTIE Nanas, MD  09/26/2024 10:51 AM    [facility]

## 2024-09-26 ENCOUNTER — Ambulatory Visit

## 2024-09-26 ENCOUNTER — Other Ambulatory Visit (HOSPITAL_COMMUNITY): Payer: Self-pay

## 2024-09-26 ENCOUNTER — Encounter: Payer: Self-pay | Admitting: Cardiology

## 2024-09-26 ENCOUNTER — Ambulatory Visit: Attending: Cardiology | Admitting: Cardiology

## 2024-09-26 VITALS — BP 122/52 | HR 61 | Resp 16 | Ht 70.0 in | Wt 188.0 lb

## 2024-09-26 DIAGNOSIS — R002 Palpitations: Secondary | ICD-10-CM

## 2024-09-26 DIAGNOSIS — I2510 Atherosclerotic heart disease of native coronary artery without angina pectoris: Secondary | ICD-10-CM | POA: Diagnosis not present

## 2024-09-26 DIAGNOSIS — Z01818 Encounter for other preprocedural examination: Secondary | ICD-10-CM | POA: Insufficient documentation

## 2024-09-26 DIAGNOSIS — Z951 Presence of aortocoronary bypass graft: Secondary | ICD-10-CM | POA: Insufficient documentation

## 2024-09-26 DIAGNOSIS — N184 Chronic kidney disease, stage 4 (severe): Secondary | ICD-10-CM | POA: Diagnosis not present

## 2024-09-26 DIAGNOSIS — I480 Paroxysmal atrial fibrillation: Secondary | ICD-10-CM | POA: Insufficient documentation

## 2024-09-26 DIAGNOSIS — I10 Essential (primary) hypertension: Secondary | ICD-10-CM | POA: Insufficient documentation

## 2024-09-26 MED ORDER — OMRON 3 SERIES BP MONITOR DEVI
0 refills | Status: AC
  Filled 2024-09-26: qty 1, 30d supply, fill #0

## 2024-09-26 NOTE — Progress Notes (Unsigned)
 Enrolled for [employer] to mail a ZIO XT long term holter monitor to the patients address on file.

## 2024-09-26 NOTE — Patient Instructions (Signed)
 Medication Instructions:  STOP hydralazine  *If you need a refill on your cardiac medications before your next appointment, please call your pharmacy*  Lab Work: BMET today at [facility] If you have labs (blood work) drawn today and your tests are completely normal, you will receive your results only by: MyChart Message (if you have MyChart) OR A paper copy in the mail If you have any lab test that is abnormal or we need to change your treatment, we will call you to review the results.  Testing/Procedures: 14 Day Zio Heart Monitor Your physician has requested that you wear a Zio heart monitor for __14___ days. This will be mailed to your home with instructions on how to apply the monitor and how to return it when finished. Please allow 2 weeks after returning the heart monitor before our office calls you with the results.   Follow-Up: At [facility], you and your health needs are our priority.  As part of our continuing mission to provide you with exceptional heart care, our providers are all part of one team.  This team includes your primary Cardiologist (physician) and Advanced Practice Providers or APPs (Physician Assistants and Nurse Practitioners) who all work together to provide you with the care you need, when you need it.  Your next appointment:   3 month(s)  Provider:   Lonni LITTIE Nanas, MD    We recommend signing up for the patient portal called MyChart.  Sign up information is provided on this After Visit Summary.  MyChart is used to connect with patients for Virtual Visits (Telemedicine).  Patients are able to view lab/test results, encounter notes, upcoming appointments, etc.  Non-urgent messages can be sent to your provider as well.   To learn more about what you can do with MyChart, go to [email or web address].   Other Instructions BP Diary: take BP twice daily for 1 week and send us  the readings via MyChart.  ZIO XT- Long Term Monitor Instructions  Your  physician has requested you wear a ZIO patch monitor for 14 days.  This is a single patch monitor. [employer] supplies one patch monitor per enrollment. Additional stickers are not available. Please do not apply patch if you will be having a Nuclear Stress Test,  Echocardiogram, Cardiac CT, MRI, or Chest Xray during the period you would be wearing the  monitor. The patch cannot be worn during these tests. You cannot remove and re-apply the  ZIO XT patch monitor.  Your ZIO patch monitor will be mailed 3 day USPS to your address on file. It may take 3-5 days  to receive your monitor after you have been enrolled.  Once you have received your monitor, please review the enclosed instructions. Your monitor  has already been registered assigning a specific monitor serial # to you.  Billing and Patient Assistance Program Information  We have supplied [employer] with any of your insurance information on file for billing purposes. [employer] offers a sliding scale Patient Assistance Program for patients that do not have  insurance, or whose insurance does not completely cover the cost of the ZIO monitor.  You must apply for the Patient Assistance Program to qualify for this discounted rate.  To apply, please call [employer] at [phone], select option 4, select option 2, ask to apply for  Patient Assistance Program. Meredeth will ask your household income, and how many people  are in your household. They will quote your out-of-pocket cost based on that information.  [employer] will also be able  to set up a 34-month, interest-free payment plan if needed.  Applying the monitor   Shave hair from upper left chest.  Hold abrader disc by orange tab. Rub abrader in 40 strokes over the upper left chest as  indicated in your monitor instructions.  Clean area with 4 enclosed alcohol pads. Let dry.  Apply patch as indicated in monitor instructions. Patch will be placed under collarbone on left  side of chest with arrow  pointing upward.  Rub patch adhesive wings for 2 minutes. Remove white label marked 1. Remove the white  label marked 2. Rub patch adhesive wings for 2 additional minutes.  While looking in a mirror, press and release button in center of patch. A small green light will  flash 3-4 times. This will be your only indicator that the monitor has been turned on.  Do not shower for the first 24 hours. You may shower after the first 24 hours.  Press the button if you feel a symptom. You will hear a small click. Record Date, Time and  Symptom in the Patient Logbook.  When you are ready to remove the patch, follow instructions on the last 2 pages of Patient  Logbook. Stick patch monitor onto the last page of Patient Logbook.  Place Patient Logbook in the blue and white box. Use locking tab on box and tape box closed  securely. The blue and white box has prepaid postage on it. Please place it in the mailbox as  soon as possible. Your physician should have your test results approximately 7 days after the  monitor has been mailed back to [facility].  Call [facility] Customer Care at [phone] if you have questions regarding  your ZIO XT patch monitor. Call them immediately if you see an orange light blinking on your  monitor.  If your monitor falls off in less than 4 days, contact our Monitor department at [phone].  If your monitor becomes loose or falls off after 4 days call [employer] at [phone] for  suggestions on securing your monitor

## 2024-09-27 ENCOUNTER — Ambulatory Visit: Payer: Self-pay | Admitting: Cardiology

## 2024-09-27 DIAGNOSIS — I480 Paroxysmal atrial fibrillation: Secondary | ICD-10-CM

## 2024-09-27 DIAGNOSIS — R000 Tachycardia, unspecified: Secondary | ICD-10-CM

## 2024-09-27 DIAGNOSIS — I10 Essential (primary) hypertension: Secondary | ICD-10-CM

## 2024-09-27 DIAGNOSIS — R001 Bradycardia, unspecified: Secondary | ICD-10-CM

## 2024-09-27 LAB — BASIC METABOLIC PANEL WITH GFR
BUN/Creatinine Ratio: 12 (ref 10–24)
BUN: 28 mg/dL — ABNORMAL HIGH (ref 8–27)
CO2: 22 mmol/L (ref 20–29)
Calcium: 9 mg/dL (ref 8.6–10.2)
Chloride: 102 mmol/L (ref 96–106)
Creatinine, Ser: 2.35 mg/dL — ABNORMAL HIGH (ref 0.76–1.27)
Glucose: 217 mg/dL — ABNORMAL HIGH (ref 70–99)
Potassium: 4.6 mmol/L (ref 3.5–5.2)
Sodium: 137 mmol/L (ref 134–144)
eGFR: 29 mL/min/1.73 — ABNORMAL LOW (ref >59)

## 2024-10-09 NOTE — Telephone Encounter (Signed)
 Blood pressure high on home log, but was low when we saw him in clinic.  Recommend scheduling in pharmacy hypertension clinic.  Would ask him to bring his home BP monitor to calibrate

## 2024-10-24 ENCOUNTER — Other Ambulatory Visit: Payer: Self-pay | Admitting: Cardiology

## 2024-10-24 DIAGNOSIS — I2510 Atherosclerotic heart disease of native coronary artery without angina pectoris: Secondary | ICD-10-CM

## 2024-10-30 MED ORDER — AMLODIPINE BESYLATE 2.5 MG PO TABS: 2.5000 mg | ORAL_TABLET | Freq: Every day | ORAL | 3 refills | Status: DC

## 2024-11-12 DIAGNOSIS — R002 Palpitations: Secondary | ICD-10-CM | POA: Diagnosis not present

## 2024-11-13 NOTE — Addendum Note (Signed)
 Addended by: TRUDY MIU R on: 11/13/2024 01:09 PM   Modules accepted: Orders

## 2024-11-29 ENCOUNTER — Other Ambulatory Visit: Payer: Self-pay | Admitting: *Deleted

## 2024-11-29 DIAGNOSIS — R001 Bradycardia, unspecified: Secondary | ICD-10-CM

## 2024-11-29 DIAGNOSIS — R000 Tachycardia, unspecified: Secondary | ICD-10-CM

## 2024-12-12 ENCOUNTER — Ambulatory Visit: Admitting: Pharmacist Clinician (PhC)/ Clinical Pharmacy Specialist

## 2024-12-12 ENCOUNTER — Encounter: Payer: Self-pay | Admitting: Pharmacist Clinician (PhC)/ Clinical Pharmacy Specialist

## 2024-12-12 VITALS — BP 140/70 | HR 47

## 2024-12-12 DIAGNOSIS — I10 Essential (primary) hypertension: Secondary | ICD-10-CM | POA: Insufficient documentation

## 2024-12-12 MED ORDER — AMLODIPINE BESYLATE 5 MG PO TABS: 5.0000 mg | ORAL_TABLET | Freq: Every day | ORAL | 3 refills | Status: DC

## 2024-12-12 NOTE — Progress Notes (Cosign Needed Addendum)
 Patient ID: Dustin Goodwin                 DOB: [birth year]                      MRN: [id number]      HPI: Dustin Goodwin is a 74 y.o. male referred by Dr. Kate to HTN clinic. PMH is significant for CAD, T2DM, hypertension, hyperlipidemia, stage IV CKD  In October 2025 patient was taking hydralazine  12.5 mg twice daily, Imdur  30 mg daily, Metoprolol  25 mg daily, amlodipine  2.5 mg daily. At clinic visit in November 2025 his diastolic BP was low (122/52) and he reported feeling very fatigued. Given low BP and symptoms, hydralazine  was discontinued and he was asked to check BP twice daily for next week and to follow up with results. At follow up his BP were trending high with most systolic's ranging in the 140-170s (highest 196 mmHg) and diastolic ranging from the 60- 19d (highest 82 mmHg).   Today, the patient brought in his home blood pressure cuff for validation. He was asked to demonstrate his home blood pressure measurement technique, during which it was noted that the cuff was being used incorrectly. Proper technique was reviewed and taught to both the patient and his wife. When readings from the home cuff were compared with office measurements, there was a difference of greater than 10 mmHg. The patient also had a newer blood pressure monitor that he was unsure how to use, therefore recommended to him to begin using the newer device instead.  Given the elevated BP in the office (150/78 mmHg) and a low HR, it was recommended to increase amlodipine  from 2.5 mg to 5 mg daily. The patient will continue to monitor his blood pressure at home and follow up with Dr. Kate in February.    Current HTN meds:  Imdur  (isosorbide  mononitrate ER) 30 mg daily, Metoprolol  Succinate 50 mg daily, Amlodipine  2.5 mg daily Previously tried: Hydralazine  12.5mg  twice daily (d/c due to soft diastolic BP and feelings of fatigue)   BP goal: <130/80  Family History:  M: CAD Brother: CAD   Social  History:  Alcohol: No Smoking: No  Diet: Frequently eats out during the week and regularly consumes canned foods. Patients wife is unable to cook at  home regularly due not being able to stand for long periods    Exercise: Minimal due to knee pain.     Wt Readings from Last 3 Encounters:  09/26/24 188 lb (85.3 kg)  03/30/24 192 lb (87.1 kg)  09/01/23 192 lb 3.2 oz (87.2 kg)   BP Readings from Last 3 Encounters:  12/12/24 (!) 150/78  09/26/24 (!) 122/52  03/30/24 (!) 148/68   Pulse Readings from Last 3 Encounters:  12/12/24 (!) 47  09/26/24 61  03/30/24 (!) 58    Renal function: CrCl cannot be calculated (Patient's most recent lab result is older than the maximum 21 days allowed.).  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   Assessment/Plan:  1. Hypertension -  Problem  Hypertension   Hypertension Assessment: BP is un/controlled in office BP 150/78 mmHg above the goal (<130/80). Home BP device was  found to be inaccurate; patient educated on proper technique. Tolerates Amlodipine , Metoprolol  Succinate and Imdur  well without any side effects Denies SOB, palpitation, chest pain, headaches,or swelling The patient is unable to maintain a regular exercise routine due to chronic pain and has difficulty implementing dietary changes because of physical limitations  Plan:  Increase Amlodipine  from 2.5mg  to 5mg  daily  Continue taking Metoprolol  Succinate 50mg  daily, Imdur  30mg  ER daily  Patient to keep record of BP readings with heart rate and report to us  at the next visit Patient to see with Dr. Kate in February for follow up   Thank you, Zunaira Afsar, PharmD Candidate    I was with student and patient  for entire visit and agree with above assessment and plan.  Shelvie Salsberry PharmD [facility] [address] [place], [place] [address]     [1]  Current Outpatient Medications on File Prior to Visit  Medication Sig Dispense Refill   aspirin  81 MG chewable tablet Chew 81 mg by mouth daily.     atorvastatin  (LIPITOR ) 80 MG tablet Take 1 tablet (80 mg total) by mouth daily. 90 tablet 1   Blood Pressure Monitoring (OMRON 3 SERIES BP MONITOR) DEVI Use to self monitor blood pressure. 1 each 0   Coenzyme Q10 200 MG capsule Take 200 mg by mouth daily.     glimepiride  (AMARYL ) 4 MG tablet Take 2 mg by mouth daily.     HYDROcodone-acetaminophen  (NORCO) 7.5-325 MG tablet Take 1 tablet by mouth every 6 (six) hours as needed for moderate pain or severe pain.     isosorbide  mononitrate (IMDUR ) 30 MG 24 hr tablet Take 30 mg by mouth daily.     metoprolol  succinate (TOPROL -XL) 50 MG 24 hr tablet Take 25 mg by mouth daily.     nitroGLYCERIN  (NITROSTAT ) 0.4 MG SL tablet Place 1 tablet (0.4 mg total) under the tongue every 5 (five) minutes as needed for chest pain. 25 tablet 3   Omega-3 Fatty Acids (FISH OIL PO) Take 1,500 mg by mouth daily.     No current facility-administered medications on file prior to visit.  [2] No Known Allergies

## 2024-12-12 NOTE — Patient Instructions (Signed)
 Follow up appointment: with Dr. Kate in February  Take your BP meds as follows:  INCREASE AMLODIPINE  TO 5 MG ONCE DAILY (TAKE 2 OF THE 2.5 MG TABS DAILY UNTIL GONE)  CONTINUE WITH ALL OTHER MEDICATIONS  Check your blood pressure at home daily (if able) and keep record of the readings.  Your blood pressure goal is < 130/80  To check your pressure at home you will need to:  1. Sit up in a chair, with feet flat on the floor and back supported. Do not cross your ankles or legs. 2. Rest your left arm so that the cuff is about heart level. If the cuff goes on your upper arm,  then just relax the arm on the table, arm of the chair or your lap. If you have a wrist cuff, we  suggest relaxing your wrist against your chest (think of it as Pledging the Flag with the  wrong arm).  3. Place the cuff snugly around your arm, about 1 inch above the crook of your elbow. The  cords should be inside the groove of your elbow.  4. Sit quietly, with the cuff in place, for about 5 minutes. After that 5 minutes press the power  button to start a reading. 5. Do not talk or move while the reading is taking place.  6. Record your readings on a sheet of paper. Although most cuffs have a memory, it is often  easier to see a pattern developing when the numbers are all in front of you.  7. You can repeat the reading after 1-3 minutes if it is recommended  Make sure your bladder is empty and you have not had caffeine or tobacco within the last 30 min  Always bring your blood pressure log with you to your appointments. If you have not brought your monitor in to be double checked for accuracy, please bring it to your next appointment.  You can find a list of quality blood pressure cuffs at [email or web address]  Important lifestyle changes to control high blood pressure  Intervention  Effect on the BP  Lose extra pounds and watch your waistline Weight loss is one of the most effective lifestyle changes for controlling  blood pressure. If you're overweight or obese, losing even a small amount of weight can help reduce blood pressure. Blood pressure might go down by about 1 millimeter of mercury (mm Hg) with each kilogram (about 2.2 pounds) of weight lost.  Exercise regularly As a general goal, aim for at least 30 minutes of moderate physical activity every day. Regular physical activity can lower high blood pressure by about 5 to 8 mm Hg.  Eat a healthy diet Eating a diet rich in whole grains, fruits, vegetables, and low-fat dairy products and low in saturated fat and cholesterol. A healthy diet can lower high blood pressure by up to 11 mm Hg.  Reduce salt (sodium) in your diet Even a small reduction of sodium in the diet can improve heart health and reduce high blood pressure by about 5 to 6 mm Hg.  Limit alcohol One drink equals 12 ounces of beer, 5 ounces of wine, or 1.5 ounces of 80-proof liquor.  Limiting alcohol to less than one drink a day for women or two drinks a day for men can help lower blood pressure by about 4 mm Hg.   If you have any questions or concerns please use My Chart to send questions or call the office at [phone]

## 2024-12-12 NOTE — Assessment & Plan Note (Signed)
 Assessment: BP is un/controlled in office BP 150/78 mmHg above the goal (<130/80). Home BP device was found to be inaccurate; patient educated on proper technique. Tolerates Amlodipine , Metoprolol  Succinate and Imdur  well without any side effects Denies SOB, palpitation, chest pain, headaches,or swelling The patient is unable to maintain a regular exercise routine due to chronic pain and has difficulty implementing dietary changes because of physical limitations  Plan:  Increase Amlodipine  from 2.5mg  to 5mg  daily  Continue taking Metoprolol  Succinate 50mg  daily, Imdur  30mg  ER daily  Patient to keep record of BP readings with heart rate and report to us  at the next visit Patient to see with Dr. Kate in February for follow up

## 2024-12-13 ENCOUNTER — Encounter: Payer: Self-pay | Admitting: Cardiology

## 2024-12-13 ENCOUNTER — Encounter: Payer: Self-pay | Admitting: Pharmacist Clinician (PhC)/ Clinical Pharmacy Specialist

## 2024-12-18 MED ORDER — AMLODIPINE BESYLATE 10 MG PO TABS: 10.0000 mg | ORAL_TABLET | Freq: Every day | ORAL | 1 refills | Status: AC

## 2025-01-19 ENCOUNTER — Ambulatory Visit: Admitting: Cardiology
